# Patient Record
Sex: Female | Born: 1955 | Race: White | Marital: Married | Smoking: Former smoker
Health system: Southern US, Community
[De-identification: ages and names within clinical notes are randomized; demographics above are authoritative.]

## PROBLEM LIST (undated history)

## (undated) DIAGNOSIS — I471 Supraventricular tachycardia, unspecified: Secondary | ICD-10-CM

## (undated) DIAGNOSIS — K635 Polyp of colon: Secondary | ICD-10-CM

## (undated) DIAGNOSIS — M8580 Other specified disorders of bone density and structure, unspecified site: Secondary | ICD-10-CM

## (undated) DIAGNOSIS — E785 Hyperlipidemia, unspecified: Secondary | ICD-10-CM

## (undated) DIAGNOSIS — G43909 Migraine, unspecified, not intractable, without status migrainosus: Secondary | ICD-10-CM

## (undated) HISTORY — DX: Supraventricular tachycardia, unspecified: I47.10

## (undated) HISTORY — PX: FRACTURE SURGERY: SHX138

## (undated) HISTORY — DX: Other specified disorders of bone density and structure, unspecified site: M85.80

## (undated) HISTORY — PX: SINUS SURGERY WITH INSTATRAK: SHX5215

## (undated) HISTORY — PX: COLONOSCOPY: SHX174

## (undated) HISTORY — PX: ABDOMINAL HYSTERECTOMY: SHX81

## (undated) HISTORY — DX: Polyp of colon: K63.5

## (undated) HISTORY — PX: OTHER SURGICAL HISTORY: SHX169

## (undated) HISTORY — DX: Migraine, unspecified, not intractable, without status migrainosus: G43.909

## (undated) HISTORY — DX: Supraventricular tachycardia: I47.1

## (undated) HISTORY — DX: Hyperlipidemia, unspecified: E78.5

---

## 2010-07-28 ENCOUNTER — Encounter: Payer: Self-pay | Admitting: Internal Medicine

## 2010-07-28 ENCOUNTER — Ambulatory Visit (HOSPITAL_COMMUNITY)
Admission: RE | Admit: 2010-07-28 | Discharge: 2010-07-28 | Disposition: A | Discharge status: Home / Self Care | Source: Ambulatory Visit | Attending: Internal Medicine | Admitting: Internal Medicine

## 2010-07-28 ENCOUNTER — Ambulatory Visit: Payer: Self-pay | Admitting: Internal Medicine

## 2010-07-28 ENCOUNTER — Other Ambulatory Visit: Payer: Self-pay | Admitting: Internal Medicine

## 2010-07-28 ENCOUNTER — Encounter: Admitting: Internal Medicine

## 2010-07-28 DIAGNOSIS — Z1211 Encounter for screening for malignant neoplasm of colon: Secondary | ICD-10-CM | POA: Insufficient documentation

## 2010-07-28 DIAGNOSIS — K648 Other hemorrhoids: Secondary | ICD-10-CM | POA: Insufficient documentation

## 2010-07-28 DIAGNOSIS — D126 Benign neoplasm of colon, unspecified: Secondary | ICD-10-CM | POA: Insufficient documentation

## 2010-07-28 DIAGNOSIS — K5730 Diverticulosis of large intestine without perforation or abscess without bleeding: Secondary | ICD-10-CM | POA: Insufficient documentation

## 2010-07-28 DIAGNOSIS — E785 Hyperlipidemia, unspecified: Secondary | ICD-10-CM | POA: Insufficient documentation

## 2010-07-30 ENCOUNTER — Encounter: Payer: Self-pay | Admitting: Internal Medicine

## 2010-08-02 NOTE — Op Note (Signed)
  Sonia Lyons, Sonia Lyons               ACCOUNT NO.:  [id number]  MEDICAL RECORD NO.:  [id number]           PATIENT TYPE:  O  LOCATION:  DAYP                          FACILITY:  [facility]  PHYSICIAN:  R. Roetta Sessions, M.D. DATE OF BIRTH:  [birth year]  DATE OF PROCEDURE:  07/28/2010 DATE OF DISCHARGE:                              OPERATIVE REPORT   PROCEDURE:  Colonoscopy with snare polypectomy.  INDICATIONS FOR PROCEDURE:  A 55 year old lady with no lower GI tract symptoms, sent over courtesy Dr. Oval Linsey, for colorectal cancer screening.  No family history of colon cancer in any first-degree relatives, 2 grandparents did have colon cancer but in advanced age.  No prior imaging.  Colonoscopy is now being done as a screening maneuver. Risks, benefits, limitations, alternatives, and imponderable have been discussed, questions answered.  Please see the documentation medical record.  PROCEDURE NOTE:  O2 saturation, blood pressure, pulse, and respirations monitored throughout the entire procedure.  CONSCIOUS SEDATION:  Versed 6 mg IV and Demerol 100 mg IV in divided doses.  INSTRUMENT:  Pentax video chip system.  FINDINGS:  Digital rectal exam revealed no abnormalities.  Endoscopic findings:  Prep was adequate.  Colon:  Colonic mucosa was surveyed from the rectosigmoid junction through the left transverse right colon to the appendiceal orifice, ileocecal valve/cecum.  These structures were well seen and photographed for the record.  From this level, scope was slowly and cautiously withdrawn.  All previously mentioned mucosal surfaces were again seen.  On the way in, the patient was noted to have a 5-mm polyp on a stalk in the midst descending colon, it was hot snared and recovered.  The patient was also noted a few scattered pancolonic diverticula.  Remainder of colonic mucosa appeared normal.  The scope was pulled down in the rectum.  A thorough examination of rectal  mucosa including retroflexed view of the anal verge demonstrated only internal hemorrhoids.  The patient tolerated the procedure well.  Cecal withdrawal time 14 minutes.  IMPRESSION: 1. Internal hemorrhoids, otherwise normal rectum. 2. Few scattered pancolonic diverticula. 3. Descending colon polyp status post snare polypectomy.  RECOMMENDATIONS: 1. Diverticulosis and polyp literature provided to Ms. Eckels. 2. Follow up on path/further recommendations to follow.     Jonathon Bellows, M.D.     RMR/MEDQ  D:  07/28/2010  T:  07/28/2010  Job:  [id number]  cc:   Melvyn Novas, MD Fax: [phone]  Electronically Signed by Lorrin Goodell M.D. on 08/02/2010 10:27:41 AM

## 2010-08-03 NOTE — Letter (Addendum)
Summary: Patient Notice, Colon Biopsy Results  [facility]  [address]   [place], [place] [address]   Phone: [phone]  Fax: [phone]       July 30, 2010   Sonia Lyons [address] [place], [place]  [address] 04/22/1956    Dear Ms. Vecchio,  I am pleased to inform you that the biopsies taken during your recent colonoscopy did not show any evidence of cancer upon pathologic examination.  Additional information/recommendations:  No further action is needed at this time.  Please follow-up with your primary care physician for your other healthcare needs.  You should have a repeat colonoscopy examination  in 7 years.  Please call us if you are having persistent problems or have questions about your condition that have not been fully answered at this time.  Sincerely,    R. Roetta Sessions MD, FACP [facility] Ph: [phone]    Fax: [phone]   Appended Document: Patient Notice, Colon Biopsy Results letter mailed to pt  Appended Document: Patient Notice, Colon Biopsy Results reminder in epic

## 2010-09-01 ENCOUNTER — Other Ambulatory Visit (HOSPITAL_COMMUNITY): Payer: Self-pay | Admitting: Family Medicine

## 2010-09-01 DIAGNOSIS — Z139 Encounter for screening, unspecified: Secondary | ICD-10-CM

## 2010-09-20 ENCOUNTER — Ambulatory Visit (HOSPITAL_COMMUNITY)
Admission: RE | Admit: 2010-09-20 | Discharge: 2010-09-20 | Disposition: A | Discharge status: Home / Self Care | Source: Ambulatory Visit | Attending: Family Medicine | Admitting: Family Medicine

## 2010-09-20 DIAGNOSIS — Z1231 Encounter for screening mammogram for malignant neoplasm of breast: Secondary | ICD-10-CM | POA: Insufficient documentation

## 2010-09-20 DIAGNOSIS — Z139 Encounter for screening, unspecified: Secondary | ICD-10-CM

## 2010-09-22 ENCOUNTER — Other Ambulatory Visit: Payer: Self-pay | Admitting: Family Medicine

## 2010-09-22 DIAGNOSIS — R928 Other abnormal and inconclusive findings on diagnostic imaging of breast: Secondary | ICD-10-CM

## 2010-09-23 ENCOUNTER — Other Ambulatory Visit: Payer: Self-pay | Admitting: Family Medicine

## 2010-09-23 DIAGNOSIS — R928 Other abnormal and inconclusive findings on diagnostic imaging of breast: Secondary | ICD-10-CM

## 2010-09-24 ENCOUNTER — Ambulatory Visit
Admission: RE | Admit: 2010-09-24 | Discharge: 2010-09-24 | Disposition: A | Discharge status: Home / Self Care | Source: Ambulatory Visit | Attending: Family Medicine | Admitting: Family Medicine

## 2010-09-24 DIAGNOSIS — R928 Other abnormal and inconclusive findings on diagnostic imaging of breast: Secondary | ICD-10-CM

## 2011-10-24 ENCOUNTER — Other Ambulatory Visit: Payer: Self-pay | Admitting: Family Medicine

## 2011-10-24 DIAGNOSIS — Z139 Encounter for screening, unspecified: Secondary | ICD-10-CM

## 2011-10-25 ENCOUNTER — Ambulatory Visit (HOSPITAL_COMMUNITY)

## 2011-11-03 ENCOUNTER — Ambulatory Visit (HOSPITAL_COMMUNITY)
Admission: RE | Admit: 2011-11-03 | Discharge: 2011-11-03 | Disposition: A | Discharge status: Home / Self Care | Source: Ambulatory Visit | Attending: Family Medicine | Admitting: Family Medicine

## 2011-11-03 ENCOUNTER — Other Ambulatory Visit: Payer: Self-pay | Admitting: Family Medicine

## 2011-11-03 DIAGNOSIS — Z139 Encounter for screening, unspecified: Secondary | ICD-10-CM

## 2011-11-03 DIAGNOSIS — Z1231 Encounter for screening mammogram for malignant neoplasm of breast: Secondary | ICD-10-CM | POA: Insufficient documentation

## 2011-11-08 ENCOUNTER — Other Ambulatory Visit: Payer: Self-pay | Admitting: Family Medicine

## 2011-11-08 DIAGNOSIS — R928 Other abnormal and inconclusive findings on diagnostic imaging of breast: Secondary | ICD-10-CM

## 2011-11-23 ENCOUNTER — Ambulatory Visit (HOSPITAL_COMMUNITY)

## 2011-11-23 ENCOUNTER — Ambulatory Visit (HOSPITAL_COMMUNITY)
Admission: RE | Admit: 2011-11-23 | Discharge: 2011-11-23 | Disposition: A | Discharge status: Home / Self Care | Source: Ambulatory Visit | Attending: Family Medicine | Admitting: Family Medicine

## 2011-11-23 ENCOUNTER — Other Ambulatory Visit (HOSPITAL_COMMUNITY): Payer: Self-pay | Admitting: Family Medicine

## 2011-11-23 DIAGNOSIS — R928 Other abnormal and inconclusive findings on diagnostic imaging of breast: Secondary | ICD-10-CM

## 2011-11-23 DIAGNOSIS — N63 Unspecified lump in unspecified breast: Secondary | ICD-10-CM | POA: Insufficient documentation

## 2013-04-25 ENCOUNTER — Other Ambulatory Visit: Payer: Self-pay | Admitting: Family Medicine

## 2013-04-25 DIAGNOSIS — Z139 Encounter for screening, unspecified: Secondary | ICD-10-CM

## 2013-04-30 ENCOUNTER — Ambulatory Visit (HOSPITAL_COMMUNITY)
Admission: RE | Admit: 2013-04-30 | Discharge: 2013-04-30 | Disposition: A | Discharge status: Home / Self Care | Source: Ambulatory Visit | Attending: Family Medicine | Admitting: Family Medicine

## 2013-04-30 DIAGNOSIS — Z1231 Encounter for screening mammogram for malignant neoplasm of breast: Secondary | ICD-10-CM | POA: Insufficient documentation

## 2013-04-30 DIAGNOSIS — Z139 Encounter for screening, unspecified: Secondary | ICD-10-CM

## 2013-05-07 ENCOUNTER — Other Ambulatory Visit: Payer: Self-pay | Admitting: Family Medicine

## 2013-05-07 DIAGNOSIS — R928 Other abnormal and inconclusive findings on diagnostic imaging of breast: Secondary | ICD-10-CM

## 2013-05-10 ENCOUNTER — Other Ambulatory Visit

## 2013-05-10 DIAGNOSIS — Z79899 Other long term (current) drug therapy: Secondary | ICD-10-CM

## 2013-05-10 DIAGNOSIS — Z0000 Encounter for general adult medical examination without abnormal findings: Secondary | ICD-10-CM

## 2013-05-10 DIAGNOSIS — E785 Hyperlipidemia, unspecified: Secondary | ICD-10-CM

## 2013-05-10 LAB — LIPID PANEL
LDL Cholesterol: 140 mg/dL — ABNORMAL HIGH (ref 0–99)
VLDL: 19 mg/dL (ref 0–40)

## 2013-05-10 LAB — COMPLETE METABOLIC PANEL WITH GFR
ALT: 9 U/L (ref 0–35)
AST: 14 U/L (ref 0–37)
Albumin: 4.5 g/dL (ref 3.5–5.2)
Calcium: 9.6 mg/dL (ref 8.4–10.5)
Chloride: 102 mEq/L (ref 96–112)
Creat: 0.85 mg/dL (ref 0.50–1.10)
Potassium: 4.8 mEq/L (ref 3.5–5.3)

## 2013-05-10 LAB — CBC WITH DIFFERENTIAL/PLATELET
Hemoglobin: 13.4 g/dL (ref 12.0–15.0)
Lymphs Abs: 1.3 10*3/uL (ref 0.7–4.0)
Monocytes Relative: 9 % (ref 3–12)
Neutro Abs: 1.8 10*3/uL (ref 1.7–7.7)
Neutrophils Relative %: 52 % (ref 43–77)
RBC: 4.37 MIL/uL (ref 3.87–5.11)

## 2013-05-10 LAB — TSH: TSH: 0.688 u[IU]/mL (ref 0.350–4.500)

## 2013-05-11 LAB — VITAMIN D 25 HYDROXY (VIT D DEFICIENCY, FRACTURES): Vit D, 25-Hydroxy: 49 ng/mL (ref 30–89)

## 2013-05-13 ENCOUNTER — Ambulatory Visit (INDEPENDENT_AMBULATORY_CARE_PROVIDER_SITE_OTHER): Admitting: Family Medicine

## 2013-05-13 ENCOUNTER — Encounter: Payer: Self-pay | Admitting: Family Medicine

## 2013-05-13 VITALS — BP 120/60 | HR 64 | Temp 97.9°F | Resp 14 | Ht 72.0 in | Wt 154.0 lb

## 2013-05-13 DIAGNOSIS — Z0000 Encounter for general adult medical examination without abnormal findings: Secondary | ICD-10-CM

## 2013-05-13 DIAGNOSIS — G43909 Migraine, unspecified, not intractable, without status migrainosus: Secondary | ICD-10-CM | POA: Insufficient documentation

## 2013-05-13 NOTE — Progress Notes (Signed)
Subjective:    Patient ID: Sonia Lyons, female    DOB: [birth year], 57 y.o.   MRN: [id number]  HPI  Patient is here today for complete physical exam.  She has no concerns. She sees a GYN doctor who performs her breast exam, her Pap smear, and her pelvic exam. Her mammogram is up-to-date. Her Pap smear is up to date. Her colonoscopy was performed in 2012. Her last tetanus shot was in 2012.  She had a flu shot earlier this year. Her most recent labwork as listed: Lab on 05/10/2013  Component Date Value Range Status  . Cholesterol 05/10/2013 216* 0 - 200 mg/dL Final   Comment: ATP III Classification:                                < 200        mg/dL        Desirable                               200 - 239     mg/dL        Borderline High                               >= 240        mg/dL        High                             . Triglycerides 05/10/2013 93  <150 mg/dL Final  . HDL 21/30/8657 57  >39 mg/dL Final  . Total CHOL/HDL Ratio 05/10/2013 3.8   Final  . VLDL 05/10/2013 19  0 - 40 mg/dL Final  . LDL Cholesterol 05/10/2013 140* 0 - 99 mg/dL Final   Comment:                            Total Cholesterol/HDL Ratio:CHD Risk                                                 Coronary Heart Disease Risk Table                                                                 Men       Women                                   1/2 Average Risk              3.4        3.3                                       Average Risk  5.0        4.4                                    2X Average Risk              9.6        7.1                                    3X Average Risk             23.4       11.0                          Use the calculated Patient Ratio above and the CHD Risk table                           to determine the patient's CHD Risk.                          ATP III Classification (LDL):                                < 100        mg/dL         Optimal        100 - 129     mg/dL         Near or Above Optimal                               130 - 159     mg/dL         Borderline High                               160 - 189     mg/dL         High                                > 190        mg/dL         Very High                             . WBC 05/10/2013 3.4* 4.0 - 10.5 K/uL Final  . RBC 05/10/2013 4.37  3.87 - 5.11 MIL/uL Final  . Hemoglobin 05/10/2013 13.4  12.0 - 15.0 g/dL Final  . HCT 40/98/1191 39.4  36.0 - 46.0 % Final  . MCV 05/10/2013 90.2  78.0 - 100.0 fL Final  . MCH 05/10/2013 30.7  26.0 - 34.0 pg Final  . MCHC 05/10/2013 34.0  30.0 - 36.0 g/dL Final  . RDW 47/82/9562 12.9  11.5 - 15.5 % Final  . Platelets 05/10/2013 225  150 - 400 K/uL Final  . Neutrophils Relative % 05/10/2013 52  43 - 77 % Final  . Neutro Abs 05/10/2013 1.8  1.7 - 7.7 K/uL Final  . Lymphocytes  Relative 05/10/2013 38  12 - 46 % Final  . Lymphs Abs 05/10/2013 1.3  0.7 - 4.0 K/uL Final  . Monocytes Relative 05/10/2013 9  3 - 12 % Final  . Monocytes Absolute 05/10/2013 0.3  0.1 - 1.0 K/uL Final  . Eosinophils Relative 05/10/2013 1  0 - 5 % Final  . Eosinophils Absolute 05/10/2013 0.0  0.0 - 0.7 K/uL Final  . Basophils Relative 05/10/2013 0  0 - 1 % Final  . Basophils Absolute 05/10/2013 0.0  0.0 - 0.1 K/uL Final  . Smear Review 05/10/2013 Criteria for review not met   Final  . TSH 05/10/2013 0.688  0.350 - 4.500 uIU/mL Final  . Vit D, 25-Hydroxy 05/10/2013 49  30 - 89 ng/mL Final   Comment: This assay accurately quantifies Vitamin D, which is the sum of the                          25-Hydroxy forms of Vitamin D2 and D3.  Studies have shown that the                          optimum concentration of 25-Hydroxy Vitamin D is 30 ng/mL or higher.                           Concentrations of Vitamin D between 20 and 29 ng/mL are considered to                          be insufficient and concentrations less than 20 ng/mL are considered                          to be  deficient for Vitamin D.  . Sodium 05/10/2013 139  135 - 145 mEq/L Final  . Potassium 05/10/2013 4.8  3.5 - 5.3 mEq/L Final  . Chloride 05/10/2013 102  96 - 112 mEq/L Final  . CO2 05/10/2013 30  19 - 32 mEq/L Final  . Glucose, Bld 05/10/2013 75  70 - 99 mg/dL Final  . BUN 16/02/9603 16  6 - 23 mg/dL Final  . Creat 54/01/8118 0.85  0.50 - 1.10 mg/dL Final  . Total Bilirubin 05/10/2013 0.7  0.3 - 1.2 mg/dL Final  . Alkaline Phosphatase 05/10/2013 51  39 - 117 U/L Final  . AST 05/10/2013 14  0 - 37 U/L Final  . ALT 05/10/2013 9  0 - 35 U/L Final  . Total Protein 05/10/2013 6.9  6.0 - 8.3 g/dL Final  . Albumin 14/78/2956 4.5  3.5 - 5.2 g/dL Final  . Calcium 21/30/8657 9.6  8.4 - 10.5 mg/dL Final  . GFR, Est African American 05/10/2013 89   Final  . GFR, Est Non African American 05/10/2013 77   Final   Comment:                            The estimated GFR is a calculation valid for adults (>=80 years old)                          that uses the CKD-EPI algorithm to adjust for age and sex. It is  not to be used for children, pregnant women, hospitalized patients,                             patients on dialysis, or with rapidly changing kidney function.                          According to the NKDEP, eGFR >89 is normal, 60-89 shows mild                          impairment, 30-59 shows moderate impairment, 15-29 shows severe                          impairment and <15 is ESRD.                              Past Medical History  Diagnosis Date  . Hyperlipidemia   . Migraine   . Colon polyp   . SVT (supraventricular tachycardia)    Past Surgical History  Procedure Laterality Date  . Abdominal hysterectomy      fibroids   No current outpatient prescriptions on file prior to visit.   No current facility-administered medications on file prior to visit.   Allergies  Allergen Reactions  . Codeine Nausea Only   History   Social History  . Marital Status:  Married    Spouse Name: N/A    Number of Children: N/A  . Years of Education: N/A   Occupational History  . Not on file.   Social History Main Topics  . Smoking status: Former Games developer  . Smokeless tobacco: Not on file  . Alcohol Use: Yes     Comment: Rare  . Drug Use: No  . Sexual Activity: Yes     Comment: married to [place]   Other Topics Concern  . Not on file   Social History Narrative  . No narrative on file   Family History  Problem Relation Age of Onset  . Hyperlipidemia Father   . Diabetes Sister   . Cancer Sister 49    breast   . HIV Brother      Review of Systems  All other systems reviewed and are negative.       Objective:   Physical Exam  Vitals reviewed. Constitutional: She is oriented to person, place, and time. She appears well-developed and well-nourished. No distress.  HENT:  Head: Normocephalic and atraumatic.  Right Ear: External ear normal.  Left Ear: External ear normal.  Nose: Nose normal.  Mouth/Throat: Oropharynx is clear and moist. No oropharyngeal exudate.  Eyes: Conjunctivae and EOM are normal. Pupils are equal, round, and reactive to light. Right eye exhibits no discharge. Left eye exhibits no discharge. No scleral icterus.  Neck: Normal range of motion. Neck supple. No JVD present. No tracheal deviation present. No thyromegaly present.  Cardiovascular: Normal rate, regular rhythm, normal heart sounds and intact distal pulses.  Exam reveals no gallop and no friction rub.   No murmur heard. Pulmonary/Chest: Effort normal and breath sounds normal. No stridor. No respiratory distress. She has no wheezes. She has no rales. She exhibits no tenderness.  Abdominal: Soft. Bowel sounds are normal. She exhibits no distension and no mass. There is no tenderness. There is no rebound and no guarding.  Musculoskeletal: Normal  range of motion. She exhibits no edema and no tenderness.  Lymphadenopathy:    She has no cervical adenopathy.    Neurological: She is alert and oriented to person, place, and time. She has normal reflexes. She displays normal reflexes. No cranial nerve deficit. She exhibits normal muscle tone. Coordination normal.  Skin: Skin is warm. No rash noted. She is not diaphoretic. No erythema. No pallor.  Psychiatric: She has a normal mood and affect. Her behavior is normal. Thought content normal.          Assessment & Plan:  1. Routine general medical examination at a health care facility Patient's physical exam today is completely normal. Her cancer screening is up to date. Her immunizations are up-to-date. The only abnormality seen on her lab work as an LDL in the 140s. Her goal LDL based on risk factors would be less than 160. She has one wrist point for age greater than 18 years old. She has no hypertension. She has no family history of early coronary artery disease. She has no smoking history. Her HDL is normal. She is not a diabetic. Therefore I do not see any indication for starting the patient on statin therapy. I did recommend suture of 2 g by mouth daily and a low saturated fat diet. The patient is in agreement with this.

## 2013-05-20 ENCOUNTER — Ambulatory Visit
Admission: RE | Admit: 2013-05-20 | Discharge: 2013-05-20 | Disposition: A | Discharge status: Home / Self Care | Source: Ambulatory Visit | Attending: Family Medicine | Admitting: Family Medicine

## 2013-05-20 DIAGNOSIS — R928 Other abnormal and inconclusive findings on diagnostic imaging of breast: Secondary | ICD-10-CM

## 2014-03-27 ENCOUNTER — Ambulatory Visit (INDEPENDENT_AMBULATORY_CARE_PROVIDER_SITE_OTHER): Admitting: Family Medicine

## 2014-03-27 ENCOUNTER — Encounter: Payer: Self-pay | Admitting: Family Medicine

## 2014-03-27 VITALS — BP 110/64 | HR 69 | Temp 97.6°F | Resp 18 | Ht 72.0 in | Wt 156.0 lb

## 2014-03-27 DIAGNOSIS — R002 Palpitations: Secondary | ICD-10-CM

## 2014-03-27 LAB — CBC WITH DIFFERENTIAL/PLATELET
BASOS ABS: 0 10*3/uL (ref 0.0–0.1)
BASOS PCT: 0 % (ref 0–1)
Eosinophils Absolute: 0 10*3/uL (ref 0.0–0.7)
Eosinophils Relative: 1 % (ref 0–5)
HCT: 39.9 % (ref 36.0–46.0)
HEMOGLOBIN: 13.5 g/dL (ref 12.0–15.0)
LYMPHS PCT: 38 % (ref 12–46)
Lymphs Abs: 1.7 10*3/uL (ref 0.7–4.0)
MCH: 30.2 pg (ref 26.0–34.0)
MCHC: 33.8 g/dL (ref 30.0–36.0)
MCV: 89.3 fL (ref 78.0–100.0)
MONOS PCT: 8 % (ref 3–12)
Monocytes Absolute: 0.4 10*3/uL (ref 0.1–1.0)
NEUTROS ABS: 2.4 10*3/uL (ref 1.7–7.7)
NEUTROS PCT: 53 % (ref 43–77)
Platelets: 263 10*3/uL (ref 150–400)
RBC: 4.47 MIL/uL (ref 3.87–5.11)
RDW: 13.4 % (ref 11.5–15.5)
WBC: 4.6 10*3/uL (ref 4.0–10.5)

## 2014-03-27 LAB — COMPLETE METABOLIC PANEL WITH GFR
ALBUMIN: 4.4 g/dL (ref 3.5–5.2)
ALK PHOS: 66 U/L (ref 39–117)
ALT: 11 U/L (ref 0–35)
AST: 15 U/L (ref 0–37)
BUN: 12 mg/dL (ref 6–23)
CHLORIDE: 100 meq/L (ref 96–112)
CO2: 25 mEq/L (ref 19–32)
Calcium: 9.7 mg/dL (ref 8.4–10.5)
Creat: 0.79 mg/dL (ref 0.50–1.10)
GFR, Est African American: >89 mL/min
GFR, Est Non African American: 84 mL/min
Glucose, Bld: 83 mg/dL (ref 70–99)
POTASSIUM: 4.1 meq/L (ref 3.5–5.3)
SODIUM: 139 meq/L (ref 135–145)
TOTAL PROTEIN: 7.1 g/dL (ref 6.0–8.3)
Total Bilirubin: 0.4 mg/dL (ref 0.2–1.2)

## 2014-03-27 LAB — TSH: TSH: 1.234 u[IU]/mL (ref 0.350–4.500)

## 2014-03-27 NOTE — Progress Notes (Signed)
Subjective:    Patient ID: Sonia Lyons, female    DOB: [birth year], 58 y.o.   MRN: [id number]  HPI Patient is a very pleasant 58 year old white female with a history of supraventricular tachycardia. She has been doing very well up until recently. Approximate 3 weeks ago she developed a fluttering sensation in her chest. She states that it does not feel like her previous episodes of SVT. It does not feel like a heartbeat at all. Instead it feels more like a vibration underneath her left breast. She denies any syncope or chest pain. She denies any shortness of breath or dyspnea on exertion. Her EKG today shows normal sinus rhythm at 68 bpm with normal intervals and a normal axis. There is no evidence of Wolff-Parkinson-White, long QT syndrome, or Brugada syndrome.  Her symptoms do not sound like a nerve either. She denies any paresthesias or pain.  It could possibly be a muscle fasciculation in the underlying pectoralis muscle. In fact the patient states it feels similar to the way her eye feels when she has a muscle fasciculation in her eyelid. Past Medical History  Diagnosis Date  . Hyperlipidemia   . Migraine   . Colon polyp   . SVT (supraventricular tachycardia)    Past Surgical History  Procedure Laterality Date  . Abdominal hysterectomy      fibroids   Current Outpatient Prescriptions on File Prior to Visit  Medication Sig Dispense Refill  . OVER THE COUNTER MEDICATION [place]     No current facility-administered medications on file prior to visit.   Allergies  Allergen Reactions  . Codeine Nausea Only   History   Social History  . Marital Status: Married    Spouse Name: N/A    Number of Children: N/A  . Years of Education: N/A   Occupational History  . Not on file.   Social History Main Topics  . Smoking status: Former Research scientist (life sciences)  . Smokeless tobacco: Not on file  . Alcohol Use: Yes     Comment: Rare  . Drug Use: No  . Sexual Activity: Yes     Comment: married  to [place]   Other Topics Concern  . Not on file   Social History Narrative      Review of Systems  All other systems reviewed and are negative.      Objective:   Physical Exam  Constitutional: She appears well-developed and well-nourished.  Neck: Neck supple. No JVD present. No tracheal deviation present. No thyromegaly present.  Cardiovascular: Normal rate, regular rhythm and normal heart sounds.  Exam reveals no gallop and no friction rub.   No murmur heard. Pulmonary/Chest: Effort normal and breath sounds normal. No respiratory distress. She has no wheezes. She has no rales.  Abdominal: Soft. Bowel sounds are normal.  Vitals reviewed.         Assessment & Plan:  Palpitations - Plan: CBC with Differential, COMPLETE METABOLIC PANEL WITH GFR, TSH  Patient symptoms are hard to characterize. I believe she is describing a muscle fasciculation in her underlying pectoralis major muscle. However given her history of SVT I believe it is prudent that we have her wear a 24-hour cardiac monitor to rule out cardiac arrhythmias. The patient states that this occurs every day and we should be able to capture this with a 24-hour monitor. I will also check a CMP as well as a TSH to rule out electrolyte abnormalities that may be predisposing the patient to this condition. Patient  does admit that she has not been sleeping well and this may be contributing either to PVCs or even muscle fasciculations.

## 2014-03-28 ENCOUNTER — Ambulatory Visit: Admitting: Family Medicine

## 2014-03-28 ENCOUNTER — Ambulatory Visit (INDEPENDENT_AMBULATORY_CARE_PROVIDER_SITE_OTHER): Admitting: Family Medicine

## 2014-03-28 ENCOUNTER — Encounter: Payer: Self-pay | Admitting: Family Medicine

## 2014-03-28 DIAGNOSIS — R002 Palpitations: Secondary | ICD-10-CM

## 2014-03-28 NOTE — Progress Notes (Signed)
Patient ID: Sonia Lyons, female   DOB: 07/27/1955, 58 y.o.   MRN: [id number] Pt returns today after wearing 24 hr Holter Monitor.  She denies any problems while wearing monitor.  She did return her diary with some entries noted.  Pt told approximately one week turn around for results.

## 2014-04-08 ENCOUNTER — Encounter: Payer: Self-pay | Admitting: Family Medicine

## 2014-04-09 ENCOUNTER — Encounter: Payer: Self-pay | Admitting: Family Medicine

## 2014-06-06 ENCOUNTER — Other Ambulatory Visit: Payer: Self-pay | Admitting: Family Medicine

## 2014-06-06 DIAGNOSIS — Z1231 Encounter for screening mammogram for malignant neoplasm of breast: Secondary | ICD-10-CM

## 2014-06-20 ENCOUNTER — Ambulatory Visit (HOSPITAL_COMMUNITY)
Admission: RE | Admit: 2014-06-20 | Discharge: 2014-06-20 | Disposition: A | Discharge status: Home / Self Care | Source: Ambulatory Visit | Attending: Family Medicine | Admitting: Family Medicine

## 2014-06-20 DIAGNOSIS — Z1231 Encounter for screening mammogram for malignant neoplasm of breast: Secondary | ICD-10-CM | POA: Diagnosis present

## 2015-05-20 ENCOUNTER — Other Ambulatory Visit: Payer: Self-pay | Admitting: Family Medicine

## 2015-05-20 DIAGNOSIS — Z1231 Encounter for screening mammogram for malignant neoplasm of breast: Secondary | ICD-10-CM

## 2015-06-22 ENCOUNTER — Ambulatory Visit (HOSPITAL_COMMUNITY)
Admission: RE | Admit: 2015-06-22 | Discharge: 2015-06-22 | Disposition: A | Discharge status: Home / Self Care | Source: Ambulatory Visit | Attending: Family Medicine | Admitting: Family Medicine

## 2015-06-22 DIAGNOSIS — Z1231 Encounter for screening mammogram for malignant neoplasm of breast: Secondary | ICD-10-CM | POA: Insufficient documentation

## 2015-08-04 ENCOUNTER — Ambulatory Visit (INDEPENDENT_AMBULATORY_CARE_PROVIDER_SITE_OTHER): Admitting: Family Medicine

## 2015-08-04 ENCOUNTER — Encounter: Payer: Self-pay | Admitting: Family Medicine

## 2015-08-04 VITALS — BP 142/76 | HR 84 | Temp 97.6°F | Resp 16 | Wt 159.0 lb

## 2015-08-04 DIAGNOSIS — R350 Frequency of micturition: Secondary | ICD-10-CM

## 2015-08-04 DIAGNOSIS — N3001 Acute cystitis with hematuria: Secondary | ICD-10-CM

## 2015-08-04 LAB — URINALYSIS, MICROSCOPIC ONLY
CASTS: NONE SEEN [LPF]
CRYSTALS: NONE SEEN [HPF]
Squamous Epithelial / LPF: NONE SEEN [HPF] (ref <=5)
YEAST: NONE SEEN [HPF]

## 2015-08-04 LAB — URINALYSIS, ROUTINE W REFLEX MICROSCOPIC
BILIRUBIN URINE: NEGATIVE
GLUCOSE, UA: NEGATIVE
KETONES UR: NEGATIVE
Nitrite: NEGATIVE
PH: 5.5 (ref 5.0–8.0)
Protein, ur: NEGATIVE
SPECIFIC GRAVITY, URINE: 1.025 (ref 1.001–1.035)

## 2015-08-04 MED ORDER — SULFAMETHOXAZOLE-TRIMETHOPRIM 800-160 MG PO TABS: 1.0000 | ORAL_TABLET | Freq: Two times a day (BID) | ORAL | Status: DC

## 2015-08-04 NOTE — Progress Notes (Signed)
   Subjective:    Patient ID: Sonia Lyons, female    DOB: 1955/08/17, 60 y.o.   MRN: [id number]  HPI Patient reports 3 weeks of increased urinary frequency, urgency, and some hesitancy. She denies any dysuria. She has been treating herself with cranberry juice which temporarily helps the symptoms but then they returned shortly thereafter. Urinalysis today shows +2 blood and +1 leukocyte esterase Past Medical History  Diagnosis Date  . Hyperlipidemia   . Migraine   . Colon polyp   . SVT (supraventricular tachycardia) [facility]    Past Surgical History  Procedure Laterality Date  . Abdominal hysterectomy      fibroids   Current Outpatient Prescriptions on File Prior to Visit  Medication Sig Dispense Refill  . OVER THE COUNTER MEDICATION [place]     No current facility-administered medications on file prior to visit.   Allergies  Allergen Reactions  . Codeine Nausea Only   Social History   Social History  . Marital Status: Married    Spouse Name: N/A  . Number of Children: N/A  . Years of Education: N/A   Occupational History  . Not on file.   Social History Main Topics  . Smoking status: Former Research scientist (life sciences)  . Smokeless tobacco: Not on file  . Alcohol Use: Yes     Comment: Rare  . Drug Use: No  . Sexual Activity: Yes     Comment: married to [place]   Other Topics Concern  . Not on file   Social History Narrative      Review of Systems  All other systems reviewed and are negative.      Objective:   Physical Exam  Cardiovascular: Normal rate and regular rhythm.   Pulmonary/Chest: Effort normal and breath sounds normal.  Abdominal: Soft. Bowel sounds are normal.  Vitals reviewed.         Assessment & Plan:  Frequent urination - Plan: Urinalysis, Routine w reflex microscopic (not at [facility]), sulfamethoxazole-trimethoprim (BACTRIM DS,SEPTRA DS) 800-160 MG tablet, Urine culture  Acute cystitis with hematuria  Begin Bactrim double strength 1 by mouth  twice a day for 7 days. Send urine culture. If symptoms persist and urine culture is negative, consider overactive bladder and empiric treatment with Vesicare

## 2015-08-08 LAB — URINE CULTURE: Colony Count: >=100000

## 2016-01-15 ENCOUNTER — Other Ambulatory Visit

## 2016-01-15 DIAGNOSIS — E785 Hyperlipidemia, unspecified: Secondary | ICD-10-CM

## 2016-01-15 DIAGNOSIS — Z0000 Encounter for general adult medical examination without abnormal findings: Secondary | ICD-10-CM

## 2016-01-15 LAB — CBC WITH DIFFERENTIAL/PLATELET
BASOS PCT: 1 %
Basophils Absolute: 37 cells/uL (ref 0–200)
EOS ABS: 37 {cells}/uL (ref 15–500)
Eosinophils Relative: 1 %
HEMATOCRIT: 39.6 % (ref 35.0–45.0)
HEMOGLOBIN: 13 g/dL (ref 12.0–15.0)
LYMPHS ABS: 1332 {cells}/uL (ref 850–3900)
LYMPHS PCT: 36 %
MCH: 30 pg (ref 27.0–33.0)
MCHC: 32.8 g/dL (ref 32.0–36.0)
MCV: 91.2 fL (ref 80.0–100.0)
MONO ABS: 296 {cells}/uL (ref 200–950)
MPV: 9.6 fL (ref 7.5–12.5)
Monocytes Relative: 8 %
Neutro Abs: 1998 cells/uL (ref 1500–7800)
Neutrophils Relative %: 54 %
Platelets: 233 10*3/uL (ref 140–400)
RBC: 4.34 MIL/uL (ref 3.80–5.10)
RDW: 13.2 % (ref 11.0–15.0)
WBC: 3.7 10*3/uL — AB (ref 3.8–10.8)

## 2016-01-15 LAB — COMPLETE METABOLIC PANEL WITH GFR
ALBUMIN: 4.2 g/dL (ref 3.6–5.1)
ALK PHOS: 66 U/L (ref 33–130)
ALT: 10 U/L (ref 6–29)
AST: 15 U/L (ref 10–35)
BUN: 16 mg/dL (ref 7–25)
CALCIUM: 9.5 mg/dL (ref 8.6–10.4)
CHLORIDE: 104 mmol/L (ref 98–110)
CO2: 28 mmol/L (ref 20–31)
CREATININE: 0.86 mg/dL (ref 0.50–1.05)
GFR, EST AFRICAN AMERICAN: 86 mL/min (ref >=60)
GFR, Est Non African American: 75 mL/min (ref >=60)
Glucose, Bld: 79 mg/dL (ref 70–99)
POTASSIUM: 4.9 mmol/L (ref 3.5–5.3)
Sodium: 141 mmol/L (ref 135–146)
Total Bilirubin: 0.7 mg/dL (ref 0.2–1.2)
Total Protein: 6.6 g/dL (ref 6.1–8.1)

## 2016-01-15 LAB — LIPID PANEL
CHOL/HDL RATIO: 3 ratio (ref <=5.0)
CHOLESTEROL: 206 mg/dL — AB (ref 125–200)
HDL: 69 mg/dL (ref >=46)
LDL Cholesterol: 119 mg/dL (ref <130)
Triglycerides: 92 mg/dL (ref <150)
VLDL: 18 mg/dL (ref <30)

## 2016-01-15 LAB — TSH: TSH: 0.84 m[IU]/L

## 2016-01-28 ENCOUNTER — Encounter: Payer: Self-pay | Admitting: Family Medicine

## 2016-01-28 ENCOUNTER — Ambulatory Visit (INDEPENDENT_AMBULATORY_CARE_PROVIDER_SITE_OTHER): Admitting: Family Medicine

## 2016-01-28 VITALS — BP 134/70 | HR 68 | Temp 97.7°F | Resp 14 | Ht 72.0 in | Wt 158.0 lb

## 2016-01-28 DIAGNOSIS — Z0000 Encounter for general adult medical examination without abnormal findings: Secondary | ICD-10-CM

## 2016-01-28 NOTE — Progress Notes (Signed)
Subjective:    Patient ID: Sonia Lyons, female    DOB: March 07, 1956, 60 y.o.   MRN: [id number]  HPI Patient is here today for complete physical exam. Her only concern is some mild hair loss in an androgenic pattern. Otherwise she is doing well. Last colonoscopy was performed in 2012. Her last mammogram was performed in January 2017. Pap smears performed by her gynecologist. Tetanus shots up-to-date. She is not due for shingles vaccine or pneumonia vaccine yet. She is due for a flu shot but she declines this today. There is no family history of osteoporosis. She's never had a bone density test but she is not yet 67. Most recent lab work as listed below Lab on 01/15/2016  Component Date Value Ref Range Status  . Sodium 01/15/2016 141  135 - 146 mmol/L Final  . Potassium 01/15/2016 4.9  3.5 - 5.3 mmol/L Final  . Chloride 01/15/2016 104  98 - 110 mmol/L Final  . CO2 01/15/2016 28  20 - 31 mmol/L Final  . Glucose, Bld 01/15/2016 79  70 - 99 mg/dL Final  . BUN 01/15/2016 16  7 - 25 mg/dL Final  . Creat 01/15/2016 0.86  0.50 - 1.05 mg/dL Final   Comment:   For patients > or = 58 years of age: The upper reference limit for Creatinine is approximately 13% higher for people identified as African-American.     . Total Bilirubin 01/15/2016 0.7  0.2 - 1.2 mg/dL Final  . Alkaline Phosphatase 01/15/2016 66  33 - 130 U/L Final  . AST 01/15/2016 15  10 - 35 U/L Final  . ALT 01/15/2016 10  6 - 29 U/L Final  . Total Protein 01/15/2016 6.6  6.1 - 8.1 g/dL Final  . Albumin 01/15/2016 4.2  3.6 - 5.1 g/dL Final  . Calcium 01/15/2016 9.5  8.6 - 10.4 mg/dL Final  . GFR, Est African American 01/15/2016 86  >=60 mL/min Final  . GFR, Est Non African American 01/15/2016 75  >=60 mL/min Final  . TSH 01/15/2016 0.84  mIU/L Final   Comment:   Reference Range   > or = 20 Years  0.40-4.50   Pregnancy Range First trimester  0.26-2.66 Second trimester 0.55-2.73 Third trimester  0.43-2.91     . Cholesterol  01/15/2016 206* 125 - 200 mg/dL Final  . Triglycerides 01/15/2016 92  <150 mg/dL Final  . HDL 01/15/2016 69  >=46 mg/dL Final  . Total CHOL/HDL Ratio 01/15/2016 3.0  <=5.0 Ratio Final  . VLDL 01/15/2016 18  <30 mg/dL Final  . LDL Cholesterol 01/15/2016 119  <130 mg/dL Final   Comment:   Total Cholesterol/HDL Ratio:CHD Risk                        Coronary Heart Disease Risk Table                                        Men       Women          1/2 Average Risk              3.4        3.3              Average Risk              5.0  4.4           2X Average Risk              9.6        7.1           3X Average Risk             23.4       11.0 Use the calculated Patient Ratio above and the CHD Risk table  to determine the patient's CHD Risk.   . WBC 01/15/2016 3.7* 3.8 - 10.8 K/uL Final  . RBC 01/15/2016 4.34  3.80 - 5.10 MIL/uL Final  . Hemoglobin 01/15/2016 13.0  12.0 - 15.0 g/dL Final  . HCT 01/15/2016 39.6  35.0 - 45.0 % Final  . MCV 01/15/2016 91.2  80.0 - 100.0 fL Final  . MCH 01/15/2016 30.0  27.0 - 33.0 pg Final  . MCHC 01/15/2016 32.8  32.0 - 36.0 g/dL Final  . RDW 01/15/2016 13.2  11.0 - 15.0 % Final  . Platelets 01/15/2016 233  140 - 400 K/uL Final  . MPV 01/15/2016 9.6  7.5 - 12.5 fL Final  . Neutro Abs 01/15/2016 1998  1,500 - 7,800 cells/uL Final  . Lymphs Abs 01/15/2016 1332  850 - 3,900 cells/uL Final  . Monocytes Absolute 01/15/2016 296  200 - 950 cells/uL Final  . Eosinophils Absolute 01/15/2016 37  15 - 500 cells/uL Final  . Basophils Absolute 01/15/2016 37  0 - 200 cells/uL Final  . Neutrophils Relative % 01/15/2016 54  % Final  . Lymphocytes Relative 01/15/2016 36  % Final  . Monocytes Relative 01/15/2016 8  % Final  . Eosinophils Relative 01/15/2016 1  % Final  . Basophils Relative 01/15/2016 1  % Final  . Smear Review 01/15/2016 Criteria for review not met   Final   Past Medical History:  Diagnosis Date  . Colon polyp   . Hyperlipidemia   . Migraine    . SVT (supraventricular tachycardia) (HCC)    Past Surgical History:  Procedure Laterality Date  . ABDOMINAL HYSTERECTOMY     fibroids   Current Outpatient Prescriptions on File Prior to Visit  Medication Sig Dispense Refill  . OVER THE COUNTER MEDICATION [place]     No current facility-administered medications on file prior to visit.    Allergies  Allergen Reactions  . Codeine Nausea Only   Social History   Social History  . Marital status: Married    Spouse name: N/A  . Number of children: N/A  . Years of education: N/A   Occupational History  . Not on file.   Social History Main Topics  . Smoking status: Former Research scientist (life sciences)  . Smokeless tobacco: Not on file  . Alcohol use Yes     Comment: Rare  . Drug use: No  . Sexual activity: Yes     Comment: married to [place]   Other Topics Concern  . Not on file   Social History Narrative  . No narrative on file   Family History  Problem Relation Age of Onset  . Hyperlipidemia Father   . Diabetes Sister   . Cancer Sister 13    breast   . HIV Brother    Mom died suddenly at 35 either to myocardial infarction or possibly pulmonary embolism   Review of Systems  All other systems reviewed and are negative.      Objective:   Physical Exam  Constitutional: She is oriented to person,  place, and time. She appears well-developed and well-nourished. No distress.  HENT:  Head: Normocephalic and atraumatic.  Right Ear: External ear normal.  Left Ear: External ear normal.  Nose: Nose normal.  Mouth/Throat: Oropharynx is clear and moist. No oropharyngeal exudate.  Eyes: Conjunctivae and EOM are normal. Pupils are equal, round, and reactive to light. Right eye exhibits no discharge. Left eye exhibits no discharge. No scleral icterus.  Neck: Normal range of motion. Neck supple. No JVD present. No tracheal deviation present. No thyromegaly present.  Cardiovascular: Normal rate, regular rhythm, normal heart sounds and  intact distal pulses.  Exam reveals no gallop and no friction rub.   No murmur heard. Pulmonary/Chest: Effort normal and breath sounds normal. No stridor. No respiratory distress. She has no wheezes. She has no rales. She exhibits no tenderness.  Abdominal: Soft. Bowel sounds are normal. She exhibits no distension and no mass. There is no tenderness. There is no rebound and no guarding.  Musculoskeletal: Normal range of motion. She exhibits no edema, tenderness or deformity.  Lymphadenopathy:    She has no cervical adenopathy.  Neurological: She is alert and oriented to person, place, and time. She has normal reflexes. She displays normal reflexes. No cranial nerve deficit. She exhibits normal muscle tone. Coordination normal.  Skin: Skin is warm. No rash noted. She is not diaphoretic. No erythema. No pallor.  Psychiatric: She has a normal mood and affect. Her behavior is normal. Judgment and thought content normal.  Vitals reviewed.         Assessment & Plan:  Routine general medical examination at a health care facility  I reviewed her CBC, CMP, fasting lipid panel, and TSH all of which were excellent. Mammogram and colonoscopy are up-to-date. Gynecologist performs Pap smear. I would recommend bone density at 67. Offer the patient a flu shot but she declined at the present time. The remainder of her immunizations are up-to-date. I did recommend 1200 mg a day of calcium and 1000 units a day of vitamin D.

## 2016-07-11 ENCOUNTER — Other Ambulatory Visit: Payer: Self-pay | Admitting: Family Medicine

## 2016-07-11 DIAGNOSIS — Z1231 Encounter for screening mammogram for malignant neoplasm of breast: Secondary | ICD-10-CM

## 2016-07-14 ENCOUNTER — Ambulatory Visit (HOSPITAL_COMMUNITY)
Admission: RE | Admit: 2016-07-14 | Discharge: 2016-07-14 | Disposition: A | Discharge status: Home / Self Care | Source: Ambulatory Visit | Attending: Family Medicine | Admitting: Family Medicine

## 2016-07-14 ENCOUNTER — Encounter (HOSPITAL_COMMUNITY): Payer: Self-pay

## 2016-07-14 DIAGNOSIS — Z1231 Encounter for screening mammogram for malignant neoplasm of breast: Secondary | ICD-10-CM | POA: Diagnosis not present

## 2016-07-14 DIAGNOSIS — N6489 Other specified disorders of breast: Secondary | ICD-10-CM | POA: Diagnosis not present

## 2016-07-19 ENCOUNTER — Other Ambulatory Visit: Payer: Self-pay | Admitting: Family Medicine

## 2016-07-19 DIAGNOSIS — R928 Other abnormal and inconclusive findings on diagnostic imaging of breast: Secondary | ICD-10-CM

## 2016-07-26 ENCOUNTER — Ambulatory Visit (HOSPITAL_COMMUNITY)
Admission: RE | Admit: 2016-07-26 | Discharge: 2016-07-26 | Disposition: A | Discharge status: Home / Self Care | Source: Ambulatory Visit | Attending: Family Medicine | Admitting: Family Medicine

## 2016-07-26 DIAGNOSIS — R928 Other abnormal and inconclusive findings on diagnostic imaging of breast: Secondary | ICD-10-CM | POA: Diagnosis present

## 2017-05-30 ENCOUNTER — Encounter: Payer: Self-pay | Admitting: Internal Medicine

## 2017-07-07 ENCOUNTER — Ambulatory Visit (INDEPENDENT_AMBULATORY_CARE_PROVIDER_SITE_OTHER): Admitting: Family Medicine

## 2017-07-07 ENCOUNTER — Encounter: Payer: Self-pay | Admitting: Family Medicine

## 2017-07-07 VITALS — BP 134/72 | HR 102 | Temp 97.5°F | Resp 12 | Ht 72.0 in | Wt 162.0 lb

## 2017-07-07 DIAGNOSIS — M25521 Pain in right elbow: Secondary | ICD-10-CM | POA: Diagnosis not present

## 2017-07-07 DIAGNOSIS — G8929 Other chronic pain: Secondary | ICD-10-CM

## 2017-07-07 MED ORDER — MELOXICAM 15 MG PO TABS
15.0000 mg | ORAL_TABLET | Freq: Every day | ORAL | 0 refills | Status: DC
Start: 2017-07-07 — End: 2017-11-03

## 2017-07-07 NOTE — Progress Notes (Signed)
Subjective:    Patient ID: Sonia Lyons, female    DOB: Feb 19, 1956, 62 y.o.   MRN: [id number]  HPI Patient injured her right elbow in August.  She made several trips with her elbow fully extended carrying a heavy bucket full of water.  She felt a straining tear-like sensation in her elbow and immediately felt pain.  She had to stop what she was doing.  Ever since, she has had chronic mild pain in her right elbow.  The pain seems to be located near the lateral collateral ligament in the space between the olecranon process and the radial head.  It aches.  She is unable to fully extend the elbow in this area due to pain.  She is tried waiting 6 months without relief.  She has tried NSAIDs without relief.  There is no tenderness over the medial or lateral epicondyle today.  There is no tenderness to palpation over the radial head.  There is no tenderness to palpation over the olecranon process.  There is no crepitus in the left elbow.  There is no visible swelling erythema or bruising or effusion. Past Medical History:  Diagnosis Date  . Colon polyp   . Hyperlipidemia   . Migraine   . SVT (supraventricular tachycardia) ([place])    Current Outpatient Medications on File Prior to Visit  Medication Sig Dispense Refill  . MULTIPLE VITAMIN PO Take by mouth.     No current facility-administered medications on file prior to visit.    Allergies  Allergen Reactions  . Codeine Nausea Only   Social History   Socioeconomic History  . Marital status: Married    Spouse name: Not on file  . Number of children: Not on file  . Years of education: Not on file  . Highest education level: Not on file  Social Needs  . Financial resource strain: Not on file  . Food insecurity - worry: Not on file  . Food insecurity - inability: Not on file  . Transportation needs - medical: Not on file  . Transportation needs - non-medical: Not on file  Occupational History  . Not on file  Tobacco Use  . Smoking  status: Former Research scientist (life sciences)  . Smokeless tobacco: Never Used  Substance and Sexual Activity  . Alcohol use: Yes    Comment: Rare  . Drug use: No  . Sexual activity: Yes    Comment: married to [place]  Other Topics Concern  . Not on file  Social History Narrative  . Not on file      Review of Systems  All other systems reviewed and are negative.      Objective:   Physical Exam  Cardiovascular: Normal rate, regular rhythm and normal heart sounds.  Pulmonary/Chest: Effort normal and breath sounds normal.  Musculoskeletal:       Right elbow: She exhibits normal range of motion, no swelling, no effusion and no deformity. Tenderness found. Radial head tenderness noted. No medial epicondyle, no lateral epicondyle and no olecranon process tenderness noted.  Vitals reviewed.         Assessment & Plan:  Chronic elbow pain, right - Plan: Ambulatory referral to Orthopedic Surgery  I believe the patient has suffered a ligament tear in the elbow or could maybe have a loose bodies in the elbow causing instability and chronic pain.  Arthritis would be less likely on the differential diagnosis.  I will start the patient on meloxicam 15 mg daily and if no better the  next 2 weeks, I would recommend a referral to orthopedic surgery to determine if the patient would benefit from arthroscopy.  Given her inability to fully extend the elbow, I am concerned that she may have loose bodies within the elbow joint due to a cartilage tear that may benefit from arthroscopy.

## 2017-09-21 ENCOUNTER — Other Ambulatory Visit: Payer: Self-pay | Admitting: Family Medicine

## 2017-09-21 DIAGNOSIS — Z1231 Encounter for screening mammogram for malignant neoplasm of breast: Secondary | ICD-10-CM

## 2017-09-25 ENCOUNTER — Ambulatory Visit (HOSPITAL_COMMUNITY)
Admission: RE | Admit: 2017-09-25 | Discharge: 2017-09-25 | Disposition: A | Discharge status: Home / Self Care | Source: Ambulatory Visit | Attending: Family Medicine | Admitting: Family Medicine

## 2017-09-25 DIAGNOSIS — Z1231 Encounter for screening mammogram for malignant neoplasm of breast: Secondary | ICD-10-CM | POA: Insufficient documentation

## 2017-10-03 ENCOUNTER — Encounter: Payer: Self-pay | Admitting: Internal Medicine

## 2017-10-24 ENCOUNTER — Other Ambulatory Visit: Payer: Self-pay | Admitting: Family Medicine

## 2017-10-24 DIAGNOSIS — Z0000 Encounter for general adult medical examination without abnormal findings: Secondary | ICD-10-CM

## 2017-10-27 ENCOUNTER — Other Ambulatory Visit

## 2017-10-30 ENCOUNTER — Other Ambulatory Visit

## 2017-10-30 DIAGNOSIS — Z0000 Encounter for general adult medical examination without abnormal findings: Secondary | ICD-10-CM

## 2017-11-03 ENCOUNTER — Ambulatory Visit (INDEPENDENT_AMBULATORY_CARE_PROVIDER_SITE_OTHER): Admitting: Family Medicine

## 2017-11-03 ENCOUNTER — Encounter: Payer: Self-pay | Admitting: Family Medicine

## 2017-11-03 VITALS — BP 130/72 | HR 78 | Temp 98.1°F | Resp 14 | Ht 72.0 in | Wt 163.0 lb

## 2017-11-03 DIAGNOSIS — Z114 Encounter for screening for human immunodeficiency virus [HIV]: Secondary | ICD-10-CM | POA: Diagnosis not present

## 2017-11-03 DIAGNOSIS — Z1159 Encounter for screening for other viral diseases: Secondary | ICD-10-CM

## 2017-11-03 DIAGNOSIS — Z0000 Encounter for general adult medical examination without abnormal findings: Secondary | ICD-10-CM

## 2017-11-03 NOTE — Progress Notes (Signed)
Subjective:    Patient ID: Sonia Lyons, female    DOB: 05-09-1956, 62 y.o.   MRN: [id number]  HPI Patient is here today for complete physical exam.  She has no major medical concerns.  Her last mammogram was in May and was normal.  She has a history of a hysterectomy although she still has her ovaries.  This was performed in her early 53s.  Therefore she does not require Pap smear.  Her colonoscopy is due however she has already scheduled this herself and it should be completed in the next month.  Immunizations are up-to-date.  She is due for hepatitis C and HIV screening.  She request that I do this.  I will try to see if we can add this to her original blood work. Appointment on 10/30/2017  Component Date Value Ref Range Status  . WBC 10/30/2017 4.1  3.8 - 10.8 Thousand/uL Final  . RBC 10/30/2017 4.29  3.80 - 5.10 Million/uL Final  . Hemoglobin 10/30/2017 13.3  11.7 - 15.5 g/dL Final  . HCT 10/30/2017 39.0  35.0 - 45.0 % Final  . MCV 10/30/2017 90.9  80.0 - 100.0 fL Final  . MCH 10/30/2017 31.0  27.0 - 33.0 pg Final  . MCHC 10/30/2017 34.1  32.0 - 36.0 g/dL Final  . RDW 10/30/2017 12.3  11.0 - 15.0 % Final  . Platelets 10/30/2017 262  140 - 400 Thousand/uL Final  . MPV 10/30/2017 10.4  7.5 - 12.5 fL Final  . Neutro Abs 10/30/2017 2,194  1,500 - 7,800 cells/uL Final  . Lymphs Abs 10/30/2017 1,398  850 - 3,900 cells/uL Final  . WBC mixed population 10/30/2017 418  200 - 950 cells/uL Final  . Eosinophils Absolute 10/30/2017 62  15 - 500 cells/uL Final  . Basophils Absolute 10/30/2017 29  0 - 200 cells/uL Final  . Neutrophils Relative % 10/30/2017 53.5  % Final  . Total Lymphocyte 10/30/2017 34.1  % Final  . Monocytes Relative 10/30/2017 10.2  % Final  . Eosinophils Relative 10/30/2017 1.5  % Final  . Basophils Relative 10/30/2017 0.7  % Final  . Glucose, Bld 10/30/2017 86  65 - 99 mg/dL Final   Comment: .            Fasting reference interval .   . BUN 10/30/2017 13  7 - 25  mg/dL Final  . Creat 10/30/2017 0.90  0.50 - 1.05 mg/dL Final   Comment: For patients >64 years of age, the reference limit for Creatinine is approximately 13% higher for people identified as African-American. .   Havery Moros Ratio 36/64/4034 NOT APPLICABLE  6 - 22 (calc) Final  . Sodium 10/30/2017 140  135 - 146 mmol/L Final  . Potassium 10/30/2017 4.4  3.5 - 5.3 mmol/L Final  . Chloride 10/30/2017 104  98 - 110 mmol/L Final  . CO2 10/30/2017 28  20 - 32 mmol/L Final  . Calcium 10/30/2017 9.9  8.6 - 10.4 mg/dL Final  . Total Protein 10/30/2017 6.9  6.1 - 8.1 g/dL Final  . Albumin 10/30/2017 4.6  3.6 - 5.1 g/dL Final  . Globulin 10/30/2017 2.3  1.9 - 3.7 g/dL (calc) Final  . AG Ratio 10/30/2017 2.0  1.0 - 2.5 (calc) Final  . Total Bilirubin 10/30/2017 0.8  0.2 - 1.2 mg/dL Final  . Alkaline phosphatase (APISO) 10/30/2017 68  33 - 130 U/L Final  . AST 10/30/2017 21  10 - 35 U/L Final  . ALT 10/30/2017 15  6 - 29 U/L Final  . Cholesterol 10/30/2017 223* <200 mg/dL Final  . HDL 10/30/2017 71  >50 mg/dL Final  . Triglycerides 10/30/2017 94  <150 mg/dL Final  . LDL Cholesterol (Calc) 10/30/2017 132* mg/dL (calc) Final   Comment: Reference range: <100 . Desirable range <100 mg/dL for primary prevention;   <70 mg/dL for patients with CHD or diabetic patients  with > or = 2 CHD risk factors. Marland Kitchen LDL-C is now calculated using the Martin-Hopkins  calculation, which is a validated novel method providing  better accuracy than the Friedewald equation in the  estimation of LDL-C.  Cresenciano Genre et al. Annamaria Helling. 4268;341(96): 2061-2068  (http://education.[facility].com/faq/FAQ164)   . Total CHOL/HDL Ratio 10/30/2017 3.1  <5.0 (calc) Final  . Non-HDL Cholesterol (Calc) 10/30/2017 152* <130 mg/dL (calc) Final   Comment: For patients with diabetes plus 1 major ASCVD risk  factor, treating to a non-HDL-C goal of <100 mg/dL  (LDL-C of <70 mg/dL) is considered a therapeutic  option.   . TSH  10/30/2017 0.90  0.40 - 4.50 mIU/L Final   Past Medical History:  Diagnosis Date  . Colon polyp   . Hyperlipidemia   . Migraine   . SVT (supraventricular tachycardia) ([place])     Current Outpatient Medications on File Prior to Visit  Medication Sig Dispense Refill  . MULTIPLE VITAMIN PO Take by mouth.     No current facility-administered medications on file prior to visit.    Allergies  Allergen Reactions  . Codeine Nausea Only   Social History   Socioeconomic History  . Marital status: Married    Spouse name: Not on file  . Number of children: Not on file  . Years of education: Not on file  . Highest education level: Not on file  Occupational History  . Not on file  Social Needs  . Financial resource strain: Not on file  . Food insecurity:    Worry: Not on file    Inability: Not on file  . Transportation needs:    Medical: Not on file    Non-medical: Not on file  Tobacco Use  . Smoking status: Former Research scientist (life sciences)  . Smokeless tobacco: Never Used  Substance and Sexual Activity  . Alcohol use: Yes    Comment: Rare  . Drug use: No  . Sexual activity: Yes    Comment: married to [place]  . Physical activity:    Days per week: Not on file    Minutes per session: Not on file  . Stress: Not on file  Relationships  . Social connections:    Talks on phone: Not on file    Gets together: Not on file    Attends religious service: Not on file    Active member of club or organization: Not on file    Attends meetings of clubs or organizations: Not on file    Relationship status: Not on file  . Intimate partner violence:    Fear of current or ex partner: Not on file    Emotionally abused: Not on file    Physically abused: Not on file    Forced sexual activity: Not on file  Other Topics Concern  . Not on file  Social History Narrative  . Not on file   Family History  Problem Relation Age of Onset  . Hyperlipidemia Father   . Diabetes Sister   . Cancer Sister  80       breast   . HIV Brother  Mom died suddenly at 35 either to myocardial infarction or possibly pulmonary embolism   Review of Systems  All other systems reviewed and are negative.      Objective:   Physical Exam  Constitutional: She is oriented to person, place, and time. She appears well-developed and well-nourished. No distress.  HENT:  Head: Normocephalic and atraumatic.  Right Ear: External ear normal.  Left Ear: External ear normal.  Nose: Nose normal.  Mouth/Throat: Oropharynx is clear and moist. No oropharyngeal exudate.  Eyes: Pupils are equal, round, and reactive to light. Conjunctivae and EOM are normal. Right eye exhibits no discharge. Left eye exhibits no discharge. No scleral icterus.  Neck: Normal range of motion. Neck supple. No JVD present. No tracheal deviation present. No thyromegaly present.  Cardiovascular: Normal rate, regular rhythm, normal heart sounds and intact distal pulses. Exam reveals no gallop and no friction rub.  No murmur heard. Pulmonary/Chest: Effort normal and breath sounds normal. No stridor. No respiratory distress. She has no wheezes. She has no rales. She exhibits no tenderness.  Abdominal: Soft. Bowel sounds are normal. She exhibits no distension and no mass. There is no tenderness. There is no rebound and no guarding.  Musculoskeletal: Normal range of motion. She exhibits no edema, tenderness or deformity.  Lymphadenopathy:    She has no cervical adenopathy.  Neurological: She is alert and oriented to person, place, and time. She has normal reflexes. No cranial nerve deficit. She exhibits normal muscle tone. Coordination normal.  Skin: Skin is warm. No rash noted. She is not diaphoretic. No erythema. No pallor.  Psychiatric: She has a normal mood and affect. Her behavior is normal. Judgment and thought content normal.  Vitals reviewed.         Assessment & Plan:  Encounter for hepatitis C screening test for low risk patient -  Plan: Hepatitis C Antibody  Encounter for screening for HIV - Plan: HIV antibody (with reflex)  Routine general medical examination at a health care facility  Lab work is excellent.  I calculated her 10-year risk of cardiovascular disease to be 2.8%.  Therefore I would not recommend a statin medication.  Immunizations are up-to-date.  I would recommend the shingles vaccine at 62.  I will try to screen the patient for HIV and hepatitis C.  Mammogram is up-to-date.  Colonoscopy is already been scheduled.  Pap smear is not necessary.  Regular anticipatory guidance is provided.

## 2017-11-07 ENCOUNTER — Ambulatory Visit (INDEPENDENT_AMBULATORY_CARE_PROVIDER_SITE_OTHER): Payer: Self-pay

## 2017-11-07 DIAGNOSIS — Z86010 Personal history of colonic polyps: Secondary | ICD-10-CM

## 2017-11-07 LAB — CBC WITH DIFFERENTIAL/PLATELET
BASOS PCT: 0.7 %
Basophils Absolute: 29 cells/uL (ref 0–200)
EOS ABS: 62 {cells}/uL (ref 15–500)
Eosinophils Relative: 1.5 %
HCT: 39 % (ref 35.0–45.0)
HEMOGLOBIN: 13.3 g/dL (ref 11.7–15.5)
LYMPHS ABS: 1398 {cells}/uL (ref 850–3900)
MCH: 31 pg (ref 27.0–33.0)
MCHC: 34.1 g/dL (ref 32.0–36.0)
MCV: 90.9 fL (ref 80.0–100.0)
MONOS PCT: 10.2 %
MPV: 10.4 fL (ref 7.5–12.5)
NEUTROS ABS: 2194 {cells}/uL (ref 1500–7800)
Neutrophils Relative %: 53.5 %
Platelets: 262 10*3/uL (ref 140–400)
RBC: 4.29 10*6/uL (ref 3.80–5.10)
RDW: 12.3 % (ref 11.0–15.0)
Total Lymphocyte: 34.1 %
WBC mixed population: 418 cells/uL (ref 200–950)
WBC: 4.1 10*3/uL (ref 3.8–10.8)

## 2017-11-07 LAB — LIPID PANEL
Cholesterol: 223 mg/dL — ABNORMAL HIGH (ref <200)
HDL: 71 mg/dL (ref >50)
LDL Cholesterol (Calc): 132 mg/dL (calc) — ABNORMAL HIGH
NON-HDL CHOLESTEROL (CALC): 152 mg/dL — AB (ref <130)
Total CHOL/HDL Ratio: 3.1 (calc) (ref <5.0)
Triglycerides: 94 mg/dL (ref <150)

## 2017-11-07 LAB — COMPREHENSIVE METABOLIC PANEL
AG RATIO: 2 (calc) (ref 1.0–2.5)
ALT: 15 U/L (ref 6–29)
AST: 21 U/L (ref 10–35)
Albumin: 4.6 g/dL (ref 3.6–5.1)
Alkaline phosphatase (APISO): 68 U/L (ref 33–130)
BUN: 13 mg/dL (ref 7–25)
CHLORIDE: 104 mmol/L (ref 98–110)
CO2: 28 mmol/L (ref 20–32)
Calcium: 9.9 mg/dL (ref 8.6–10.4)
Creat: 0.9 mg/dL (ref 0.50–1.05)
GLOBULIN: 2.3 g/dL (ref 1.9–3.7)
GLUCOSE: 86 mg/dL (ref 65–99)
Potassium: 4.4 mmol/L (ref 3.5–5.3)
Sodium: 140 mmol/L (ref 135–146)
Total Bilirubin: 0.8 mg/dL (ref 0.2–1.2)
Total Protein: 6.9 g/dL (ref 6.1–8.1)

## 2017-11-07 LAB — TEST AUTHORIZATION

## 2017-11-07 LAB — HEPATITIS C ANTIBODY
Hepatitis C Ab: NONREACTIVE
SIGNAL TO CUT-OFF: 0.01 (ref <1.00)

## 2017-11-07 LAB — TSH: TSH: 0.9 mIU/L (ref 0.40–4.50)

## 2017-11-07 LAB — HIV ANTIBODY (ROUTINE TESTING W REFLEX): HIV: NONREACTIVE

## 2017-11-07 MED ORDER — NA SULFATE-K SULFATE-MG SULF 17.5-3.13-1.6 GM/177ML PO SOLN: 1.0000 | ORAL | 0 refills | Status: DC

## 2017-11-07 NOTE — Progress Notes (Signed)
Gastroenterology Pre-Procedure Review  Request Date:11/07/17 Requesting Physician: 7 year recall- (last tcs 07/28/10 RMR tubular adenoma)  PATIENT REVIEW QUESTIONS: The patient responded to the following health history questions as indicated:    1. Diabetes Melitis: no 2. Joint replacements in the past 12 months: no 3. Major health problems in the past 3 months: no 4. Has an artificial valve or MVP: no 5. Has a defibrillator: no 6. Has been advised in past to take antibiotics in advance of a procedure like teeth cleaning: no 7. Family history of colon cancer: yes (maternal grandparent )  32. Alcohol Use: no 9. History of sleep apnea: no  10. History of coronary artery or other vascular stents placed within the last 12 months: no 11. History of any prior anesthesia complications: yes (sometimes will vomit after surgery )    MEDICATIONS & ALLERGIES:    Patient reports the following regarding taking any blood thinners:   Plavix? no Aspirin? no Coumadin? no Brilinta? no Xarelto? no Eliquis? no Pradaxa? no Savaysa? no Effient? no  Patient confirms/reports the following medications:  Current Outpatient Medications  Medication Sig Dispense Refill  . MULTIPLE VITAMIN PO Take by mouth.    Marland Kitchen UNABLE TO FIND daily. Med Name: Barnett Hatter herbal med for hot flashes     No current facility-administered medications for this visit.     Patient confirms/reports the following allergies:  Allergies  Allergen Reactions  . Codeine Nausea Only    No orders of the defined types were placed in this encounter.   AUTHORIZATION INFORMATION Primary Insurance: tricare,  ID #: [id number] Pre-Cert / Josem Kaufmann required:  Pre-Cert / Auth #:   SCHEDULE INFORMATION: Procedure has been scheduled as follows:  Date:01/05/18 , Time:9:45  Location: [facility]  This Gastroenterology Pre-Precedure Review Form is being routed to the following provider(s): [facility]

## 2017-11-07 NOTE — Patient Instructions (Addendum)
Sonia Lyons  [birth year] MRN: [id number]     Procedure Date: 02/09/18 Time to register: 6:30am Place to register: [facility] Procedure Time: 7:30am Scheduled provider: R. Garfield Cornea, MD    PREPARATION FOR COLONOSCOPY WITH SUPREP BOWEL PREP KIT  Note: Suprep Bowel Prep Kit is a split-dose (2day) regimen. Consumption of BOTH 6-ounce bottles is required for a complete prep.  Please notify us immediately if you are diabetic, take iron supplements, or if you are on Coumadin or any other blood thinners.                                                                                                                                                    2 DAYS BEFORE PROCEDURE:  DATE: 02/07/18   DAY: Wednesday Begin clear liquid diet AFTER your lunch meal. NO SOLID FOODS after this point.  1 DAY BEFORE PROCEDURE:  DATE: 02/08/18   DAY: Thursday Continue clear liquids the entire day - NO SOLID FOOD.     At 6:00pm: Complete steps 1 through 4 below, using ONE (1) 6-ounce bottle, before going to bed. Step 1:  Pour ONE (1) 6-ounce bottle of SUPREP liquid into the mixing container.  Step 2:  Add cool drinking water to the 16 ounce line on the container and mix.  Note: Dilute the solution concentrate as directed prior to use. Step 3:  DRINK ALL the liquid in the container. Step 4:  You MUST drink an additional two (2) or more 16 ounce containers of water  over the next one (1) hour.   Continue clear liquids only EXCEPTION: If you take medications for your heart, blood pressure, or breathing, you may take these medications with a small amount of clear liquid.   DAY OF PROCEDURE:   DATE: 02/09/18   DAY: Friday    5 hours before your procedure at :2:30am Step 1:  Pour ONE (1) 6-ounce bottle of SUPREP liquid into the mixing container.  Step 2:  Add cool drinking water to the 16 ounce line on the container and mix.  Note: Dilute the solution concentrate as directed prior to  use. Step 3:  DRINK ALL the liquid in the container. Step 4:  You MUST drink an additional two (2) or more 16 ounce containers of water  over the next one (1) hour. You MUST complete the final glass of water at least  3 hours before your colonoscopy.   Nothing by mouth past 4:30am  You may take your morning medications with sip of water unless we have instructed otherwise.    Please see below for Dietary Information.  CLEAR LIQUIDS INCLUDE:  Water Jello (NOT red in color)   Ice Popsicles (NOT red in color)   Tea (sugar ok, no milk/cream) Powdered fruit flavored drinks  Coffee (sugar ok, no milk/cream) Gatorade/  Lemonade/ Kool-Aid  (NOT red in color)   Juice: apple, white grape, white cranberry Soft drinks  Clear bullion, consomme, broth (fat free beef/chicken/vegetable)  Carbonated beverages (any kind)  Strained chicken noodle soup Hard Candy   Remember: Clear liquids are liquids that will allow you to see your fingers on the other side of a clear glass. Be sure liquids are NOT red in color, and not cloudy, but CLEAR.  DO NOT EAT OR DRINK ANY OF THE FOLLOWING:  Dairy products of any kind   Cranberry juice Tomato juice / V8 juice   Grapefruit juice Orange juice     Red grape juice  Do not eat any solid foods, including such foods as: cereal, oatmeal, yogurt, fruits, vegetables, creamed soups, eggs, bread, crackers, pureed foods in a blender, etc.   HELPFUL HINTS FOR DRINKING PREP SOLUTION:   Make sure prep is extremely cold. Mix and refrigerate the the morning of the prep. You may also put in the freezer.   You may try mixing some Crystal Light or Country Time Lemonade if you prefer. Mix in small amounts; add more if necessary.  Try drinking through a straw  Rinse mouth with water or a mouthwash between glasses, to remove after-taste.  Try sipping on a cold beverage /ice/ popsicles between glasses of prep.  Place a piece of sugar-free hard candy in mouth between  glasses.  If you become nauseated, try consuming smaller amounts, or stretch out the time between glasses. Stop for 30-60 minutes, then slowly start back drinking.     OTHER INSTRUCTIONS  You will need a responsible adult at least 60 years of age to accompany you and drive you home. This person must remain in the waiting room during your procedure. The hospital will cancel your procedure if you do not have a responsible adult with you.   1. Wear loose fitting clothing that is easily removed. 2. Leave jewelry and other valuables at home.  3. Remove all body piercing jewelry and leave at home. 4. Total time from sign-in until discharge is approximately 2-3 hours. 5. You should go home directly after your procedure and rest. You can resume normal activities the day after your procedure. 6. The day of your procedure you should not:  Drive  Make legal decisions  Operate machinery  Drink alcohol  Return to work   You may call the office (Dept: [phone]) before 5:00pm, or page the doctor on call [phone]) after 5:00pm, for further instructions, if necessary.   Insurance Information YOU WILL NEED TO CHECK WITH YOUR INSURANCE COMPANY FOR THE BENEFITS OF COVERAGE YOU HAVE FOR THIS PROCEDURE.  UNFORTUNATELY, NOT ALL INSURANCE COMPANIES HAVE BENEFITS TO COVER ALL OR PART OF THESE TYPES OF PROCEDURES.  IT IS YOUR RESPONSIBILITY TO CHECK YOUR BENEFITS, HOWEVER, WE WILL BE GLAD TO ASSIST YOU WITH ANY CODES YOUR INSURANCE COMPANY MAY NEED.    PLEASE NOTE THAT MOST INSURANCE COMPANIES WILL NOT COVER A SCREENING COLONOSCOPY FOR PEOPLE UNDER THE AGE OF 50  IF YOU HAVE BCBS INSURANCE, YOU MAY HAVE BENEFITS FOR A SCREENING COLONOSCOPY BUT IF POLYPS ARE FOUND THE DIAGNOSIS WILL CHANGE AND THEN YOU MAY HAVE A DEDUCTIBLE THAT WILL NEED TO BE MET. SO PLEASE MAKE SURE YOU CHECK YOUR BENEFITS FOR A SCREENING COLONOSCOPY AS WELL AS A DIAGNOSTIC COLONOSCOPY.

## 2017-11-17 NOTE — Progress Notes (Signed)
Ok to schedule.

## 2017-12-08 NOTE — Progress Notes (Signed)
Pt called- she just found out she is having elbow surgery the week before her tcs and doesn't want to have them both done at the same time. We have moved her to 02/09/18 at 7:30am. Called and changed dates with Hoyle Sauer. New instructions done and mailed to the pt.  Her husband will be bringing her and he will have to leave and take their son to school. I called and asked Hoyle Sauer about this and she said it was fine as long as he checks her in and leaves a number where he can be reached and checks back in with the desk as soon as he can. Pt is aware of this.

## 2017-12-26 HISTORY — PX: OTHER SURGICAL HISTORY: SHX169

## 2018-02-09 ENCOUNTER — Encounter (HOSPITAL_COMMUNITY): Payer: Self-pay | Admitting: *Deleted

## 2018-02-09 ENCOUNTER — Ambulatory Visit (HOSPITAL_COMMUNITY)
Admission: RE | Admit: 2018-02-09 | Discharge: 2018-02-09 | Disposition: A | Discharge status: Home / Self Care | Source: Ambulatory Visit | Attending: Internal Medicine | Admitting: Internal Medicine

## 2018-02-09 ENCOUNTER — Other Ambulatory Visit: Payer: Self-pay

## 2018-02-09 ENCOUNTER — Encounter (HOSPITAL_COMMUNITY)
Admission: RE | Disposition: A | Discharge status: Home / Self Care | Payer: Self-pay | Source: Ambulatory Visit | Attending: Internal Medicine

## 2018-02-09 DIAGNOSIS — E785 Hyperlipidemia, unspecified: Secondary | ICD-10-CM | POA: Insufficient documentation

## 2018-02-09 DIAGNOSIS — Z1211 Encounter for screening for malignant neoplasm of colon: Secondary | ICD-10-CM | POA: Diagnosis present

## 2018-02-09 DIAGNOSIS — Z79899 Other long term (current) drug therapy: Secondary | ICD-10-CM | POA: Diagnosis not present

## 2018-02-09 DIAGNOSIS — Z87891 Personal history of nicotine dependence: Secondary | ICD-10-CM | POA: Diagnosis not present

## 2018-02-09 DIAGNOSIS — Z86010 Personal history of colonic polyps: Secondary | ICD-10-CM | POA: Insufficient documentation

## 2018-02-09 DIAGNOSIS — K5730 Diverticulosis of large intestine without perforation or abscess without bleeding: Secondary | ICD-10-CM | POA: Insufficient documentation

## 2018-02-09 HISTORY — PX: COLONOSCOPY: SHX5424

## 2018-02-09 SURGERY — COLONOSCOPY
Anesthesia: Moderate Sedation

## 2018-02-09 MED ORDER — SODIUM CHLORIDE 0.9 % IV SOLN
INTRAVENOUS | Status: DC
  Administered 2018-02-09: 07:00:00 via INTRAVENOUS

## 2018-02-09 MED ORDER — MIDAZOLAM HCL 5 MG/5ML IJ SOLN
INTRAMUSCULAR | Status: DC | PRN
  Administered 2018-02-09: 2 mg via INTRAVENOUS
  Administered 2018-02-09: 1 mg via INTRAVENOUS
  Administered 2018-02-09: 2 mg via INTRAVENOUS
  Administered 2018-02-09 (×2): 1 mg via INTRAVENOUS

## 2018-02-09 MED ORDER — MEPERIDINE HCL 100 MG/ML IJ SOLN
INTRAMUSCULAR | Status: DC | PRN
  Administered 2018-02-09: 15 mg via INTRAVENOUS
  Administered 2018-02-09: 25 mg via INTRAVENOUS

## 2018-02-09 MED ORDER — ONDANSETRON HCL 4 MG/2ML IJ SOLN
INTRAMUSCULAR | Status: DC | PRN
  Administered 2018-02-09: 4 mg via INTRAVENOUS

## 2018-02-09 MED ORDER — MIDAZOLAM HCL 5 MG/5ML IJ SOLN
INTRAMUSCULAR | Status: AC
  Filled 2018-02-09: qty 5

## 2018-02-09 MED ORDER — ONDANSETRON HCL 4 MG/2ML IJ SOLN
INTRAMUSCULAR | Status: AC
  Filled 2018-02-09: qty 2

## 2018-02-09 MED ORDER — MEPERIDINE HCL 50 MG/ML IJ SOLN
INTRAMUSCULAR | Status: AC
  Filled 2018-02-09: qty 1

## 2018-02-09 NOTE — H&P (Signed)
_0 @   Primary Care Physician:  Susy Frizzle, MD Primary Gastroenterologist:  Dr. Gala Romney  Pre-Procedure History & Physical: HPI:  Sonia Lyons is a 62 y.o. female here for surveillance colonoscopy. History colonic adenoma.  Past Medical History:  Diagnosis Date  . Colon polyp   . Hyperlipidemia   . Migraine    Migraines  . SVT (supraventricular tachycardia) (HCC)     Past Surgical History:  Procedure Laterality Date  . ABDOMINAL HYSTERECTOMY     fibroids  . Bone marrow removed from both hips    . COLONOSCOPY    . Left wrist surgery    . Right elbow surgery  12/26/2017  . SINUS SURGERY WITH INSTATRAK      Prior to Admission medications   Medication Sig Start Date End Date Taking? Authorizing Provider  MULTIPLE VITAMIN PO Take 1 tablet by mouth daily.    Yes [provider]  Na Sulfate-K Sulfate-Mg Sulf (SUPREP BOWEL PREP KIT) 17.5-3.13-1.6 GM/177ML SOLN Take 1 kit by mouth as directed. 11/07/17  Yes Annitta Needs, NP    Allergies as of 11/07/2017 - Review Complete 11/07/2017  Allergen Reaction Noted  . Codeine Nausea Only 05/13/2013    Family History  Problem Relation Age of Onset  . Hyperlipidemia Father   . Diabetes Sister   . Cancer Sister 66       breast   . HIV Brother     Social History   Socioeconomic History  . Marital status: Married    Spouse name: Not on file  . Number of children: Not on file  . Years of education: Not on file  . Highest education level: Not on file  Occupational History  . Not on file  Social Needs  . Financial resource strain: Not on file  . Food insecurity:    Worry: Not on file    Inability: Not on file  . Transportation needs:    Medical: Not on file    Non-medical: Not on file  Tobacco Use  . Smoking status: Former Research scientist (life sciences)  . Smokeless tobacco: Never Used  Substance and Sexual Activity  . Alcohol use: Yes    Comment: Rare  . Drug use: No  . Sexual activity: Yes    Comment: married to [place]  . Physical activity:    Days per week: Not on file    Minutes per session: Not on file  . Stress: Not on file  Relationships  . Social connections:    Talks on phone: Not on file    Gets together: Not on file    Attends religious service: Not on file    Active member of club or organization: Not on file    Attends meetings of clubs or organizations: Not on file    Relationship status: Not on file  . Intimate partner violence:    Fear of current or ex partner: Not on file    Emotionally abused: Not on file    Physically abused: Not on file    Forced sexual activity: Not on file  Other Topics Concern  . Not on file  Social History Narrative  . Not on file    Review of Systems: See HPI, otherwise negative ROS  Physical Exam: BP 122/71   Pulse 75   Temp 98 F (36.7 C) (Oral)   Resp (!) 21   Ht 6' (1.829 m)   Wt 71.7 kg   SpO2 100%   BMI 21.43  kg/m  General:   Alert,  Well-developed, well-nourished, pleasant and cooperative in NAD Neck:  Supple; no masses or thyromegaly. No significant cervical adenopathy. Lungs:  Clear throughout to auscultation.   No wheezes, crackles, or rhonchi. No acute distress. Heart:  Regular rate and rhythm; no murmurs, clicks, rubs,  or gallops. Abdomen: Non-distended, normal bowel sounds.  Soft and nontender without appreciable mass or hepatosplenomegaly.  Pulses:  Normal pulses noted. Extremities:  Without clubbing or edema.  Impression/Plan:  62-year-old lady here for surveillance colonoscopy. History of colonic adenoma.   The risks, benefits, limitations, alternatives and imponderables have been reviewed with the patient. Questions have been answered. All parties are agreeable.      Notice: This dictation was prepared with Dragon dictation along with smaller phrase technology. Any transcriptional errors that result from this process are unintentional and may not be corrected upon review. 

## 2018-02-09 NOTE — Discharge Instructions (Signed)
°Colonoscopy °Discharge Instructions ° °Read the instructions outlined below and refer to this sheet in the next few weeks. These discharge instructions provide you with general information on caring for yourself after you leave the hospital. Your doctor may also give you specific instructions. While your treatment has been planned according to the most current medical practices available, unavoidable complications occasionally occur. If you have any problems or questions after discharge, call Dr. Rourk at [phone]. °ACTIVITY °· You may resume your regular activity, but move at a slower pace for the next 24 hours.  °· Take frequent rest periods for the next 24 hours.  °· Walking will help get rid of the air and reduce the bloated feeling in your belly (abdomen).  °· No driving for 24 hours (because of the medicine (anesthesia) used during the test).   °· Do not sign any important legal documents or operate any machinery for 24 hours (because of the anesthesia used during the test).  °NUTRITION °· Drink plenty of fluids.  °· You may resume your normal diet as instructed by your doctor.  °· Begin with a light meal and progress to your normal diet. Heavy or fried foods are harder to digest and may make you feel sick to your stomach (nauseated).  °· Avoid alcoholic beverages for 24 hours or as instructed.  °MEDICATIONS °· You may resume your normal medications unless your doctor tells you otherwise.  °WHAT YOU CAN EXPECT TODAY °· Some feelings of bloating in the abdomen.  °· Passage of more gas than usual.  °· Spotting of blood in your stool or on the toilet paper.  °IF YOU HAD POLYPS REMOVED DURING THE COLONOSCOPY: °· No aspirin products for 7 days or as instructed.  °· No alcohol for 7 days or as instructed.  °· Eat a soft diet for the next 24 hours.  °FINDING OUT THE RESULTS OF YOUR TEST °Not all test results are available during your visit. If your test results are not back during the visit, make an appointment  with your caregiver to find out the results. Do not assume everything is normal if you have not heard from your caregiver or the medical facility. It is important for you to follow up on all of your test results.  °SEEK IMMEDIATE MEDICAL ATTENTION IF: °· You have more than a spotting of blood in your stool.  °· Your belly is swollen (abdominal distention).  °· You are nauseated or vomiting.  °· You have a temperature over 101.  °· You have abdominal pain or discomfort that is severe or gets worse throughout the day.  ° ° °Diverticulosis information provided ° °Repeat colonoscopy for screening purposes in 10 years ° ° °Diverticulosis °Diverticulosis is a condition that develops when small pouches (diverticula) form in the wall of the large intestine (colon). The colon is where water is absorbed and stool is formed. The pouches form when the inside layer of the colon pushes through weak spots in the outer layers of the colon. You may have a few pouches or many of them. °What are the causes? °The cause of this condition is not known. °What increases the risk? °The following factors may make you more likely to develop this condition: °· Being older than age 60. Your risk for this condition increases with age. Diverticulosis is rare among people younger than age 30. By age 80, many people have it. °· Eating a low-fiber diet. °· Having frequent constipation. °· Being overweight. °· Not getting enough   exercise.  Smoking.  Taking over-the-counter pain medicines, like aspirin and ibuprofen.  Having a family history of diverticulosis.  What are the signs or symptoms? In most people, there are no symptoms of this condition. If you do have symptoms, they may include:  Bloating.  Cramps in the abdomen.  Constipation or diarrhea.  Pain in the lower left side of the abdomen.  How is this diagnosed? This condition is most often diagnosed during an exam for other colon problems. Because diverticulosis usually has  no symptoms, it often cannot be diagnosed independently. This condition may be diagnosed by:  Using a flexible scope to examine the colon (colonoscopy).  Taking an X-ray of the colon after dye has been put into the colon (barium enema).  Doing a CT scan.  How is this treated? You may not need treatment for this condition if you have never developed an infection related to diverticulosis. If you have had an infection before, treatment may include:  Eating a high-fiber diet. This may include eating more fruits, vegetables, and grains.  Taking a fiber supplement.  Taking a live bacteria supplement (probiotic).  Taking medicine to relax your colon.  Taking antibiotic medicines.  Follow these instructions at home:  Drink 6-8 glasses of water or more each day to prevent constipation.  Try not to strain when you have a bowel movement.  If you have had an infection before: ? Eat more fiber as directed by your health care provider or your diet and nutrition specialist (dietitian). ? Take a fiber supplement or probiotic, if your health care provider approves.  Take over-the-counter and prescription medicines only as told by your health care provider.  If you were prescribed an antibiotic, take it as told by your health care provider. Do not stop taking the antibiotic even if you start to feel better.  Keep all follow-up visits as told by your health care provider. This is important. Contact a health care provider if:  You have pain in your abdomen.  You have bloating.  You have cramps.  You have not had a bowel movement in 3 days. Get help right away if:  Your pain gets worse.  Your bloating becomes very bad.  You have a fever or chills, and your symptoms suddenly get worse.  You vomit.  You have bowel movements that are bloody or black.  You have bleeding from your rectum. Summary  Diverticulosis is a condition that develops when small pouches (diverticula) form in  the wall of the large intestine (colon).  You may have a few pouches or many of them.  This condition is most often diagnosed during an exam for other colon problems.  If you have had an infection related to diverticulosis, treatment may include increasing the fiber in your diet, taking supplements, or taking medicines. This information is not intended to replace advice given to you by your health care provider. Make sure you discuss any questions you have with your health care provider.

## 2018-02-09 NOTE — Op Note (Signed)
[facility] Patient Name: Sonia Lyons Procedure Date: 02/09/2018 7:26 AM MRN: [id number] Date of Birth: June 23, 1955 Attending MD: Norvel Richards , MD CSN: [id number] Age: 62 Admit Type: Outpatient Procedure:                Colonoscopy Indications:              High risk colon cancer surveillance: Personal                            history of colonic polyps Providers:                Norvel Richards, MD, Janeece Riggers, RN, Nelma Rothman, Technician Referring MD:              Medicines:                Meperidine 40 mg IV, Midazolam 7 mg IV, Ondansetron                            4 mg IV Complications:            No immediate complications. Estimated Blood Loss:     Estimated blood loss: none. Procedure:                Pre-Anesthesia Assessment:                           - Prior to the procedure, a History and Physical                            was performed, and patient medications and                            allergies were reviewed. The patient's tolerance of                            previous anesthesia was also reviewed. The risks                            and benefits of the procedure and the sedation                            options and risks were discussed with the patient.                            All questions were answered, and informed consent                            was obtained. Prior Anticoagulants: The patient has                            taken no previous anticoagulant or antiplatelet  agents. ASA Grade Assessment: II - A patient with                            mild systemic disease. After reviewing the risks                            and benefits, the patient was deemed in                            satisfactory condition to undergo the procedure.                           After obtaining informed consent, the colonoscope                            was passed under direct vision. Throughout  the                            procedure, the patient's blood pressure, pulse, and                            oxygen saturations were monitored continuously. The                            [id number]-[id number] ([id number]) scope was introduced through                            the anus and advanced to the the cecum, identified                            by appendiceal orifice and ileocecal valve. The                            colonoscopy was performed without difficulty. The                            patient tolerated the procedure well. The quality                            of the bowel preparation was adequate. Scope In: 7:52:10 AM Scope Out: 8:13:26 AM Scope Withdrawal Time: 0 hours 8 minutes 4 seconds  Total Procedure Duration: 0 hours 21 minutes 16 seconds  Findings:      The perianal and digital rectal examinations were normal.      Scattered small and large-mouthed diverticula were found in the sigmoid       colon and descending colon.      The exam was otherwise without abnormality on direct and retroflexion       views. Impression:               - Diverticulosis in the sigmoid colon and in the                            descending colon.                           -  The examination was otherwise normal on direct                            and retroflexion views.                           - No specimens collected. Moderate Sedation:      Moderate (conscious) sedation was administered by the endoscopy nurse       and supervised by the endoscopist. The following parameters were       monitored: oxygen saturation, heart rate, blood pressure, respiratory       rate, EKG, adequacy of pulmonary ventilation, and response to care.       Total physician intraservice time was 27 minutes. Recommendation:           - Patient has a contact number available for                            emergencies. The signs and symptoms of potential                            delayed complications were discussed  with the                            patient. Return to normal activities tomorrow.                            Written discharge instructions were provided to the                            patient.                           - Advance diet as tolerated.                           - Repeat colonoscopy in 10 years for screening                            purposes.                           - Return to GI office PRN. Procedure Code(s):        --- Professional ---                           [phone], Colonoscopy, flexible; diagnostic, including                            collection of specimen(s) by brushing or washing,                            when performed (separate procedure)                           G0500, Moderate sedation services provided by the  same physician or other qualified health care                            professional performing a gastrointestinal                            endoscopic service that sedation supports,                            requiring the presence of an independent trained                            observer to assist in the monitoring of the                            patient's level of consciousness and physiological                            status; initial 15 minutes of intra-service time;                            patient age 38 years or older (additional time may                            be reported with [phone], as appropriate)                           [phone], Moderate sedation services provided by the                            same physician or other qualified health care                            professional performing the diagnostic or                            therapeutic service that the sedation supports,                            requiring the presence of an independent trained                            observer to assist in the monitoring of the                            patient's level of consciousness and  physiological                            status; each additional 15 minutes intraservice                            time (List separately in addition to code for  primary service) Diagnosis Code(s):        --- Professional ---                           Z86.010, Personal history of colonic polyps                           K57.30, Diverticulosis of large intestine without                            perforation or abscess without bleeding CPT copyright 2017 American Medical Association. All rights reserved. The codes documented in this report are preliminary and upon coder review may  be revised to meet current compliance requirements. Cristopher Estimable. Jensyn Shave, MD Norvel Richards, MD 02/09/2018 8:22:03 AM This report has been signed electronically. Number of Addenda: 0

## 2018-02-14 ENCOUNTER — Encounter (HOSPITAL_COMMUNITY): Payer: Self-pay | Admitting: Internal Medicine

## 2018-11-08 ENCOUNTER — Other Ambulatory Visit (HOSPITAL_COMMUNITY): Payer: Self-pay | Admitting: Family Medicine

## 2018-11-08 DIAGNOSIS — Z1231 Encounter for screening mammogram for malignant neoplasm of breast: Secondary | ICD-10-CM

## 2018-11-12 ENCOUNTER — Ambulatory Visit (HOSPITAL_COMMUNITY)
Admission: RE | Admit: 2018-11-12 | Discharge: 2018-11-12 | Disposition: A | Discharge status: Home / Self Care | Source: Ambulatory Visit | Attending: Family Medicine | Admitting: Family Medicine

## 2018-11-12 ENCOUNTER — Other Ambulatory Visit: Payer: Self-pay

## 2018-11-12 DIAGNOSIS — Z1231 Encounter for screening mammogram for malignant neoplasm of breast: Secondary | ICD-10-CM | POA: Insufficient documentation

## 2019-08-09 ENCOUNTER — Ambulatory Visit: Payer: Self-pay | Attending: Internal Medicine

## 2019-08-09 DIAGNOSIS — Z23 Encounter for immunization: Secondary | ICD-10-CM

## 2019-08-09 NOTE — Progress Notes (Signed)
   Covid-19 Vaccination Clinic  Name:  Sonia Lyons    MRN: [id number] DOB: 1955-10-15  08/09/2019  [name] was observed post Covid-19 immunization for 15 minutes without incident. She was provided with Vaccine Information Sheet and instruction to access the V-Safe system.   [name] was instructed to call 911 with any severe reactions post vaccine: Marland Kitchen Difficulty breathing  . Swelling of face and throat  . A fast heartbeat  . A bad rash all over body  . Dizziness and weakness   Immunizations Administered    Name Date Dose VIS Date Route   Moderna COVID-19 Vaccine 08/09/2019 10:04 AM 0.5 mL 04/23/2019 Intramuscular   Manufacturer: Moderna   Lot: [id number]   [id number]

## 2019-09-11 ENCOUNTER — Ambulatory Visit: Payer: Self-pay | Attending: Internal Medicine

## 2019-09-11 DIAGNOSIS — Z23 Encounter for immunization: Secondary | ICD-10-CM

## 2019-09-11 NOTE — Progress Notes (Signed)
   Covid-19 Vaccination Clinic  Name:  Sonia Lyons    MRN: [id number] DOB: July 15, 1955  09/11/2019  [name] was observed post Covid-19 immunization for 15 minutes without incident. She was provided with Vaccine Information Sheet and instruction to access the V-Safe system.   [name] was instructed to call 911 with any severe reactions post vaccine: Marland Kitchen Difficulty breathing  . Swelling of face and throat  . A fast heartbeat  . A bad rash all over body  . Dizziness and weakness   Immunizations Administered    Name Date Dose VIS Date Route   Moderna COVID-19 Vaccine 09/11/2019  1:43 PM 0.5 mL 04/2019 Intramuscular   Manufacturer: Moderna   Lot: [id number]   VaditoBE:[id number]

## 2019-12-20 ENCOUNTER — Other Ambulatory Visit (HOSPITAL_COMMUNITY): Payer: Self-pay | Admitting: Family Medicine

## 2019-12-20 DIAGNOSIS — Z1231 Encounter for screening mammogram for malignant neoplasm of breast: Secondary | ICD-10-CM

## 2019-12-30 ENCOUNTER — Other Ambulatory Visit: Payer: Self-pay

## 2019-12-30 ENCOUNTER — Ambulatory Visit (HOSPITAL_COMMUNITY)
Admission: RE | Admit: 2019-12-30 | Discharge: 2019-12-30 | Disposition: A | Discharge status: Home / Self Care | Source: Ambulatory Visit | Attending: Family Medicine | Admitting: Family Medicine

## 2019-12-30 DIAGNOSIS — Z1231 Encounter for screening mammogram for malignant neoplasm of breast: Secondary | ICD-10-CM | POA: Diagnosis not present

## 2020-06-02 ENCOUNTER — Other Ambulatory Visit

## 2020-06-09 ENCOUNTER — Other Ambulatory Visit

## 2020-06-11 ENCOUNTER — Other Ambulatory Visit: Payer: Self-pay

## 2020-06-11 DIAGNOSIS — Z136 Encounter for screening for cardiovascular disorders: Secondary | ICD-10-CM

## 2020-06-11 DIAGNOSIS — Z13220 Encounter for screening for lipoid disorders: Secondary | ICD-10-CM

## 2020-06-12 ENCOUNTER — Ambulatory Visit (INDEPENDENT_AMBULATORY_CARE_PROVIDER_SITE_OTHER): Admitting: Family Medicine

## 2020-06-12 ENCOUNTER — Encounter: Payer: Self-pay | Admitting: Family Medicine

## 2020-06-12 VITALS — BP 130/68 | HR 82 | Temp 98.2°F | Resp 14 | Ht 72.0 in | Wt 160.0 lb

## 2020-06-12 DIAGNOSIS — Z0000 Encounter for general adult medical examination without abnormal findings: Secondary | ICD-10-CM

## 2020-06-12 LAB — LIPID PANEL
Cholesterol: 265 mg/dL — ABNORMAL HIGH (ref <200)
HDL: 68 mg/dL (ref >=50)
LDL Cholesterol (Calc): 173 mg/dL (calc) — ABNORMAL HIGH
Non-HDL Cholesterol (Calc): 197 mg/dL (calc) — ABNORMAL HIGH (ref <130)
Total CHOL/HDL Ratio: 3.9 (calc) (ref <5.0)
Triglycerides: 113 mg/dL (ref <150)

## 2020-06-12 LAB — COMPLETE METABOLIC PANEL WITH GFR
AG Ratio: 1.7 (calc) (ref 1.0–2.5)
ALT: 11 U/L (ref 6–29)
AST: 17 U/L (ref 10–35)
Albumin: 4.3 g/dL (ref 3.6–5.1)
Alkaline phosphatase (APISO): 61 U/L (ref 37–153)
BUN: 13 mg/dL (ref 7–25)
CO2: 31 mmol/L (ref 20–32)
Calcium: 9.8 mg/dL (ref 8.6–10.4)
Chloride: 103 mmol/L (ref 98–110)
Creat: 0.88 mg/dL (ref 0.50–0.99)
GFR, Est African American: 82 mL/min/{1.73_m2} (ref >=60)
GFR, Est Non African American: 70 mL/min/{1.73_m2} (ref >=60)
Globulin: 2.5 g/dL (calc) (ref 1.9–3.7)
Glucose, Bld: 85 mg/dL (ref 65–99)
Potassium: 4.4 mmol/L (ref 3.5–5.3)
Sodium: 140 mmol/L (ref 135–146)
Total Bilirubin: 0.7 mg/dL (ref 0.2–1.2)
Total Protein: 6.8 g/dL (ref 6.1–8.1)

## 2020-06-12 LAB — CBC WITH DIFFERENTIAL/PLATELET
Absolute Monocytes: 303 cells/uL (ref 200–950)
Basophils Absolute: 29 cells/uL (ref 0–200)
Basophils Relative: 0.7 %
Eosinophils Absolute: 41 cells/uL (ref 15–500)
Eosinophils Relative: 1 %
HCT: 39 % (ref 35.0–45.0)
Hemoglobin: 12.9 g/dL (ref 11.7–15.5)
Lymphs Abs: 1488 cells/uL (ref 850–3900)
MCH: 30.9 pg (ref 27.0–33.0)
MCHC: 33.1 g/dL (ref 32.0–36.0)
MCV: 93.5 fL (ref 80.0–100.0)
MPV: 10.2 fL (ref 7.5–12.5)
Monocytes Relative: 7.4 %
Neutro Abs: 2239 cells/uL (ref 1500–7800)
Neutrophils Relative %: 54.6 %
Platelets: 251 10*3/uL (ref 140–400)
RBC: 4.17 10*6/uL (ref 3.80–5.10)
RDW: 11.9 % (ref 11.0–15.0)
Total Lymphocyte: 36.3 %
WBC: 4.1 10*3/uL (ref 3.8–10.8)

## 2020-06-12 NOTE — Progress Notes (Signed)
Subjective:    Patient ID: Sonia Lyons, female    DOB: [birth year], 65 y.o.   MRN: [id number]  HPI Patient is a very pleasant 65 year old Caucasian female here today for complete physical exam.  Mammogram was performed in August of last year and was normal.  Colonoscopy is up-to-date and was performed in 2019.  Patient sees a gynecologist however she has a history of a hysterectomy and therefore does not require Pap smears.  There is no premature history of osteoporosis in her family and therefore she is not due for bone density test until 67.  Her immunizations are up-to-date as listed below. Immunization History  Administered Date(s) Administered  . DTaP 09/26/2011  . Influenza,inj,Quad PF,6+ Mos 04/17/2020  . Moderna Sars-Covid-2 Vaccination 08/09/2019, 09/11/2019, 04/17/2020  . Td 09/26/2011  . Zoster Recombinat (Shingrix) 12/17/2017, 06/03/2018    Lab on 06/11/2020  Component Date Value Ref Range Status  . WBC 06/11/2020 4.1  3.8 - 10.8 Thousand/uL Final  . RBC 06/11/2020 4.17  3.80 - 5.10 Million/uL Final  . Hemoglobin 06/11/2020 12.9  11.7 - 15.5 g/dL Final  . HCT 06/11/2020 39.0  35.0 - 45.0 % Final  . MCV 06/11/2020 93.5  80.0 - 100.0 fL Final  . MCH 06/11/2020 30.9  27.0 - 33.0 pg Final  . MCHC 06/11/2020 33.1  32.0 - 36.0 g/dL Final  . RDW 06/11/2020 11.9  11.0 - 15.0 % Final  . Platelets 06/11/2020 251  140 - 400 Thousand/uL Final  . MPV 06/11/2020 10.2  7.5 - 12.5 fL Final  . Neutro Abs 06/11/2020 2,239  1,500 - 7,800 cells/uL Final  . Lymphs Abs 06/11/2020 1,488  850 - 3,900 cells/uL Final  . Absolute Monocytes 06/11/2020 303  200 - 950 cells/uL Final  . Eosinophils Absolute 06/11/2020 41  15 - 500 cells/uL Final  . Basophils Absolute 06/11/2020 29  0 - 200 cells/uL Final  . Neutrophils Relative % 06/11/2020 54.6  % Final  . Total Lymphocyte 06/11/2020 36.3  % Final  . Monocytes Relative 06/11/2020 7.4  % Final  . Eosinophils Relative 06/11/2020 1.0  % Final  .  Basophils Relative 06/11/2020 0.7  % Final  . Glucose, Bld 06/11/2020 85  65 - 99 mg/dL Final   Comment: .            Fasting reference interval .   . BUN 06/11/2020 13  7 - 25 mg/dL Final  . Creat 06/11/2020 0.88  0.50 - 0.99 mg/dL Final   Comment: For patients >65 years of age, the reference limit for Creatinine is approximately 13% higher for people identified as African-American. .   . GFR, Est Non African American 06/11/2020 70  > OR = 60 mL/min/1.52m2 Final  . GFR, Est African American 06/11/2020 82  > OR = 60 mL/min/1.81m2 Final  . BUN/Creatinine Ratio 123456 NOT APPLICABLE  6 - 22 (calc) Final  . Sodium 06/11/2020 140  135 - 146 mmol/L Final  . Potassium 06/11/2020 4.4  3.5 - 5.3 mmol/L Final  . Chloride 06/11/2020 103  98 - 110 mmol/L Final  . CO2 06/11/2020 31  20 - 32 mmol/L Final  . Calcium 06/11/2020 9.8  8.6 - 10.4 mg/dL Final  . Total Protein 06/11/2020 6.8  6.1 - 8.1 g/dL Final  . Albumin 06/11/2020 4.3  3.6 - 5.1 g/dL Final  . Globulin 06/11/2020 2.5  1.9 - 3.7 g/dL (calc) Final  . AG Ratio 06/11/2020 1.7  1.0 - 2.5 (calc) Final  .  Total Bilirubin 06/11/2020 0.7  0.2 - 1.2 mg/dL Final  . Alkaline phosphatase (APISO) 06/11/2020 61  37 - 153 U/L Final  . AST 06/11/2020 17  10 - 35 U/L Final  . ALT 06/11/2020 11  6 - 29 U/L Final  . Cholesterol 06/11/2020 265* <200 mg/dL Final  . HDL 06/11/2020 68  > OR = 50 mg/dL Final  . Triglycerides 06/11/2020 113  <150 mg/dL Final  . LDL Cholesterol (Calc) 06/11/2020 173* mg/dL (calc) Final   Comment: Reference range: <100 . Desirable range <100 mg/dL for primary prevention;   <70 mg/dL for patients with CHD or diabetic patients  with > or = 2 CHD risk factors. Marland Kitchen LDL-C is now calculated using the Martin-Hopkins  calculation, which is a validated novel method providing  better accuracy than the Friedewald equation in the  estimation of LDL-C.  Cresenciano Genre et al. Annamaria Helling. [id number]): 2061-2068   (http://education.[facility].com/faq/FAQ164)   . Total CHOL/HDL Ratio 06/11/2020 3.9  <5.0 (calc) Final  . Non-HDL Cholesterol (Calc) 06/11/2020 197* <130 mg/dL (calc) Final   Comment: For patients with diabetes plus 1 major ASCVD risk  factor, treating to a non-HDL-C goal of <100 mg/dL  (LDL-C of <70 mg/dL) is considered a therapeutic  option.    Past Medical History:  Diagnosis Date  . Colon polyp   . Hyperlipidemia   . Migraine    Migraines  . SVT (supraventricular tachycardia) (HCC)    Past Surgical History:  Procedure Laterality Date  . ABDOMINAL HYSTERECTOMY     fibroids  . Bone marrow removed from both hips    . COLONOSCOPY    . COLONOSCOPY N/A 02/09/2018   Procedure: COLONOSCOPY;  Surgeon: Daneil Dolin, MD;  Location[facility];  Service: Endoscopy;  Laterality: N/A;  9:45  . FRACTURE SURGERY N/A    Phreesia 06/09/2020  . Left wrist surgery    . Right elbow surgery  12/26/2017  . SINUS SURGERY WITH INSTATRAK     Current Outpatient Medications on File Prior to Visit  Medication Sig Dispense Refill  . MULTIPLE VITAMIN PO Take 1 tablet by mouth daily.     . Pumpkin Seed-Soy Germ (AZO BLADDER CONTROL/GO-LESS PO) Take by mouth.    . Rhubarb (ESTROVEN COMPLETE) 4 MG TABS Take by mouth.     No current facility-administered medications on file prior to visit.   Allergies  Allergen Reactions  . Codeine Nausea Only and Other (See Comments)   Social History   Socioeconomic History  . Marital status: Married    Spouse name: Not on file  . Number of children: Not on file  . Years of education: Not on file  . Highest education level: Not on file  Occupational History  . Not on file  Tobacco Use  . Smoking status: Former Research scientist (life sciences)  . Smokeless tobacco: Never Used  Vaping Use  . Vaping Use: Never used  Substance and Sexual Activity  . Alcohol use: Yes    Comment: Rare  . Drug use: No  . Sexual activity: Yes    Comment: married to [place]  Other Topics  Concern  . Not on file  Social History Narrative  . Not on file   Social Determinants of Health   Financial Resource Strain: Not on file  Food Insecurity: Not on file  Transportation Needs: Not on file  Physical Activity: Not on file  Stress: Not on file  Social Connections: Not on file  Intimate Partner Violence: Not on file  Family History  Problem Relation Age of Onset  . Hyperlipidemia Father   . Diabetes Sister   . Cancer Sister 20       breast   . HIV Brother    Mom died suddenly at 59 either to myocardial infarction or possibly pulmonary embolism   Review of Systems  All other systems reviewed and are negative.      Objective:   Physical Exam Vitals reviewed.  Constitutional:      General: She is not in acute distress.    Appearance: She is well-developed. She is not diaphoretic.  HENT:     Head: Normocephalic and atraumatic.     Right Ear: External ear normal.     Left Ear: External ear normal.     Nose: Nose normal.     Mouth/Throat:     Pharynx: No oropharyngeal exudate.  Eyes:     General: No scleral icterus.       Right eye: No discharge.        Left eye: No discharge.     Conjunctiva/sclera: Conjunctivae normal.     Pupils: Pupils are equal, round, and reactive to light.  Neck:     Thyroid: No thyromegaly.     Vascular: No JVD.     Trachea: No tracheal deviation.  Cardiovascular:     Rate and Rhythm: Normal rate and regular rhythm.     Heart sounds: Normal heart sounds. No murmur heard. No friction rub. No gallop.   Pulmonary:     Effort: Pulmonary effort is normal. No respiratory distress.     Breath sounds: Normal breath sounds. No stridor. No wheezing or rales.  Chest:     Chest wall: No tenderness.  Abdominal:     General: Bowel sounds are normal. There is no distension.     Palpations: Abdomen is soft. There is no mass.     Tenderness: There is no abdominal tenderness. There is no guarding or rebound.  Musculoskeletal:         General: No tenderness or deformity. Normal range of motion.     Cervical back: Normal range of motion and neck supple.  Lymphadenopathy:     Cervical: No cervical adenopathy.  Skin:    General: Skin is warm.     Coloration: Skin is not pale.     Findings: No erythema or rash.  Neurological:     Mental Status: She is alert and oriented to person, place, and time.     Cranial Nerves: No cranial nerve deficit.     Motor: No abnormal muscle tone.     Coordination: Coordination normal.     Deep Tendon Reflexes: Reflexes are normal and symmetric.  Psychiatric:        Behavior: Behavior normal.        Thought Content: Thought content normal.        Judgment: Judgment normal.           Assessment & Plan:  General medical exam  Physical exam today is excellent outside of her cholesterol.  I calculated her 10-year risk to be 4.5%.  We discussed whether she wanted to start taking a statin.  She has no family history of cardiovascular disease.  We discussed dietary changes, empiric use of a statin, or obtaining a coronary artery calcium score to risk stratify her more accurately.  She would like to try dietary changes and then recheck cholesterol in 6 months.  Immunizations are up-to-date.  Cancer screening is up-to-date.  Regular anticipatory guidance is provided.  Did recommend 1200 mg a day of calcium and 1000 units a day of vitamin D

## 2020-12-10 ENCOUNTER — Other Ambulatory Visit: Payer: Self-pay

## 2020-12-10 ENCOUNTER — Other Ambulatory Visit

## 2020-12-10 DIAGNOSIS — Z13228 Encounter for screening for other metabolic disorders: Secondary | ICD-10-CM

## 2020-12-10 DIAGNOSIS — Z13220 Encounter for screening for lipoid disorders: Secondary | ICD-10-CM

## 2020-12-10 LAB — LIPID PANEL
Cholesterol: 268 mg/dL — ABNORMAL HIGH (ref <200)
HDL: 77 mg/dL (ref >=50)
LDL Cholesterol (Calc): 170 mg/dL (calc) — ABNORMAL HIGH
Non-HDL Cholesterol (Calc): 191 mg/dL (calc) — ABNORMAL HIGH (ref <130)
Total CHOL/HDL Ratio: 3.5 (calc) (ref <5.0)
Triglycerides: 96 mg/dL (ref <150)

## 2020-12-10 LAB — COMPLETE METABOLIC PANEL WITH GFR
AG Ratio: 1.8 (calc) (ref 1.0–2.5)
ALT: 10 U/L (ref 6–29)
AST: 18 U/L (ref 10–35)
Albumin: 4.6 g/dL (ref 3.6–5.1)
Alkaline phosphatase (APISO): 62 U/L (ref 37–153)
BUN: 16 mg/dL (ref 7–25)
CO2: 29 mmol/L (ref 20–32)
Calcium: 9.7 mg/dL (ref 8.6–10.4)
Chloride: 103 mmol/L (ref 98–110)
Creat: 0.86 mg/dL (ref 0.50–1.05)
Globulin: 2.6 g/dL (calc) (ref 1.9–3.7)
Glucose, Bld: 82 mg/dL (ref 65–99)
Potassium: 4.2 mmol/L (ref 3.5–5.3)
Sodium: 139 mmol/L (ref 135–146)
Total Bilirubin: 0.7 mg/dL (ref 0.2–1.2)
Total Protein: 7.2 g/dL (ref 6.1–8.1)
eGFR: 76 mL/min/{1.73_m2} (ref >=60)

## 2020-12-10 LAB — CBC WITH DIFFERENTIAL/PLATELET
Absolute Monocytes: 320 cells/uL (ref 200–950)
Basophils Absolute: 20 cells/uL (ref 0–200)
Basophils Relative: 0.5 %
Eosinophils Absolute: 52 cells/uL (ref 15–500)
Eosinophils Relative: 1.3 %
HCT: 40.8 % (ref 35.0–45.0)
Hemoglobin: 13.2 g/dL (ref 11.7–15.5)
Lymphs Abs: 984 cells/uL (ref 850–3900)
MCH: 30.1 pg (ref 27.0–33.0)
MCHC: 32.4 g/dL (ref 32.0–36.0)
MCV: 92.9 fL (ref 80.0–100.0)
MPV: 10.4 fL (ref 7.5–12.5)
Monocytes Relative: 8 %
Neutro Abs: 2624 cells/uL (ref 1500–7800)
Neutrophils Relative %: 65.6 %
Platelets: 252 10*3/uL (ref 140–400)
RBC: 4.39 10*6/uL (ref 3.80–5.10)
RDW: 12.1 % (ref 11.0–15.0)
Total Lymphocyte: 24.6 %
WBC: 4 10*3/uL (ref 3.8–10.8)

## 2020-12-11 ENCOUNTER — Encounter: Payer: Self-pay | Admitting: Family Medicine

## 2020-12-11 ENCOUNTER — Ambulatory Visit (INDEPENDENT_AMBULATORY_CARE_PROVIDER_SITE_OTHER): Admitting: Family Medicine

## 2020-12-11 VITALS — BP 152/70 | HR 85 | Temp 98.1°F | Wt 160.0 lb

## 2020-12-11 DIAGNOSIS — E7800 Pure hypercholesterolemia, unspecified: Secondary | ICD-10-CM | POA: Diagnosis not present

## 2020-12-11 MED ORDER — ROSUVASTATIN CALCIUM 5 MG PO TABS: 5.0000 mg | ORAL_TABLET | Freq: Every day | ORAL | 3 refills | Status: DC

## 2020-12-11 NOTE — Progress Notes (Signed)
Subjective:    Patient ID: Sonia Lyons, female    DOB: 21-Dec-1955, 65 y.o.   MRN: [id number]  HPI At the patient's last visit, her cholesterol was found to be elevated.  She is here today to recheck that.  She has been trying aggressive lifestyle changes over the last 6 months but unfortunately her cholesterol essentially remains unchanged.  She states that her sister is also similarly very athletic and eats healthy and also has high cholesterol however they performed a coronary artery calcium score and hers was very reassuring and she elected not to take cholesterol medications based on that.  The patient denies any family history of premature cardiovascular disease.  Heart disease does not run in her family which makes her hesitant to want to take the medications unnecessarily Lab on 12/10/2020  Component Date Value Ref Range Status   Glucose, Bld 12/10/2020 82  65 - 99 mg/dL Final   Comment: .            Fasting reference interval .    BUN 12/10/2020 16  7 - 25 mg/dL Final   Creat 12/10/2020 0.86  0.50 - 1.05 mg/dL Final   eGFR 12/10/2020 76  > OR = 60 mL/min/1.31m Final   Comment: The eGFR is based on the CKD-EPI 2021 equation. To calculate  the new eGFR from a previous Creatinine or Cystatin C result, go to https://www.kidney.org/professionals/ kdoqi/gfr%5Fcalculator    BUN/Creatinine Ratio 064/40/3474NOT APPLICABLE  6 - 22 (calc) Final   Sodium 12/10/2020 139  135 - 146 mmol/L Final   Potassium 12/10/2020 4.2  3.5 - 5.3 mmol/L Final   Chloride 12/10/2020 103  98 - 110 mmol/L Final   CO2 12/10/2020 29  20 - 32 mmol/L Final   Calcium 12/10/2020 9.7  8.6 - 10.4 mg/dL Final   Total Protein 12/10/2020 7.2  6.1 - 8.1 g/dL Final   Albumin 12/10/2020 4.6  3.6 - 5.1 g/dL Final   Globulin 12/10/2020 2.6  1.9 - 3.7 g/dL (calc) Final   AG Ratio 12/10/2020 1.8  1.0 - 2.5 (calc) Final   Total Bilirubin 12/10/2020 0.7  0.2 - 1.2 mg/dL Final   Alkaline phosphatase (APISO) 12/10/2020 62   37 - 153 U/L Final   AST 12/10/2020 18  10 - 35 U/L Final   ALT 12/10/2020 10  6 - 29 U/L Final   Cholesterol 12/10/2020 268 (A) <200 mg/dL Final   HDL 12/10/2020 77  > OR = 50 mg/dL Final   Triglycerides 12/10/2020 96  <150 mg/dL Final   LDL Cholesterol (Calc) 12/10/2020 170 (A) mg/dL (calc) Final   Comment: Reference range: <100 . Desirable range <100 mg/dL for primary prevention;   <70 mg/dL for patients with CHD or diabetic patients  with > or = 2 CHD risk factors. .Marland KitchenLDL-C is now calculated using the Martin-Hopkins  calculation, which is a validated novel method providing  better accuracy than the Friedewald equation in the  estimation of LDL-C.  MCresenciano Genreet al. JAnnamaria Helling 22595;638(75: 2061-2068  (http://education.[facility].com/faq/FAQ164)    Total CHOL/HDL Ratio 12/10/2020 3.5  <5.0 (calc) Final   Non-HDL Cholesterol (Calc) 12/10/2020 191 (A) <130 mg/dL (calc) Final   Comment: For patients with diabetes plus 1 major ASCVD risk  factor, treating to a non-HDL-C goal of <100 mg/dL  (LDL-C of <70 mg/dL) is considered a therapeutic  option.    WBC 12/10/2020 4.0  3.8 - 10.8 Thousand/uL Final   RBC 12/10/2020 4.39  3.80 -  5.10 Million/uL Final   Hemoglobin 12/10/2020 13.2  11.7 - 15.5 g/dL Final   HCT 12/10/2020 40.8  35.0 - 45.0 % Final   MCV 12/10/2020 92.9  80.0 - 100.0 fL Final   MCH 12/10/2020 30.1  27.0 - 33.0 pg Final   MCHC 12/10/2020 32.4  32.0 - 36.0 g/dL Final   RDW 12/10/2020 12.1  11.0 - 15.0 % Final   Platelets 12/10/2020 252  140 - 400 Thousand/uL Final   MPV 12/10/2020 10.4  7.5 - 12.5 fL Final   Neutro Abs 12/10/2020 2,624  1,500 - 7,800 cells/uL Final   Lymphs Abs 12/10/2020 984  850 - 3,900 cells/uL Final   Absolute Monocytes 12/10/2020 320  200 - 950 cells/uL Final   Eosinophils Absolute 12/10/2020 52  15 - 500 cells/uL Final   Basophils Absolute 12/10/2020 20  0 - 200 cells/uL Final   Neutrophils Relative % 12/10/2020 65.6  % Final   Total Lymphocyte  12/10/2020 24.6  % Final   Monocytes Relative 12/10/2020 8.0  % Final   Eosinophils Relative 12/10/2020 1.3  % Final   Basophils Relative 12/10/2020 0.5  % Final    Past Medical History:  Diagnosis Date   Colon polyp    Hyperlipidemia    Migraine    Migraines   SVT (supraventricular tachycardia) (HCC)    Current Outpatient Medications on File Prior to Visit  Medication Sig Dispense Refill   MULTIPLE VITAMIN PO Take 1 tablet by mouth daily.      Pumpkin Seed-Soy Germ (AZO BLADDER CONTROL/GO-LESS PO) Take by mouth.     Rhubarb (ESTROVEN COMPLETE) 4 MG TABS Take by mouth.     No current facility-administered medications on file prior to visit.   Allergies  Allergen Reactions   Codeine Nausea Only and Other (See Comments)   Social History   Socioeconomic History   Marital status: Married    Spouse name: Not on file   Number of children: Not on file   Years of education: Not on file   Highest education level: Not on file  Occupational History   Not on file  Tobacco Use   Smoking status: Former   Smokeless tobacco: Never  Vaping Use   Vaping Use: Never used  Substance and Sexual Activity   Alcohol use: Yes    Comment: Rare   Drug use: No   Sexual activity: Yes    Comment: married to [place]  Other Topics Concern   Not on file  Social History Narrative   Not on file   Social Determinants of Health   Financial Resource Strain: Not on file  Food Insecurity: Not on file  Transportation Needs: Not on file  Physical Activity: Not on file  Stress: Not on file  Social Connections: Not on file  Intimate Partner Violence: Not on file      Review of Systems  All other systems reviewed and are negative.     Objective:   Physical Exam Vitals reviewed.  Cardiovascular:     Rate and Rhythm: Normal rate and regular rhythm.     Heart sounds: Normal heart sounds.  Pulmonary:     Effort: Pulmonary effort is normal.     Breath sounds: Normal breath sounds.           Assessment & Plan:  Pure hypercholesterolemia - Plan: CT CARDIAC SCORING (SELF PAY ONLY) If possible, the patient would like to risk stratify with a coronary artery CT scan.  Based on her  family history and her sisters recent results, she may not require statin.  However based on her labs I would recommend Crestor at least 5 mg a day and then recheck lab work in 6 months.  Patient will start the medication but she would like to check and see how much the coronary artery CT scan will cost because it may help her make an informed decision about continuing cholesterol medication indefinitely

## 2020-12-31 ENCOUNTER — Ambulatory Visit (HOSPITAL_COMMUNITY)
Admission: RE | Admit: 2020-12-31 | Discharge: 2020-12-31 | Disposition: A | Discharge status: Home / Self Care | Source: Ambulatory Visit | Attending: Family Medicine | Admitting: Family Medicine

## 2020-12-31 ENCOUNTER — Other Ambulatory Visit: Payer: Self-pay

## 2020-12-31 DIAGNOSIS — E7800 Pure hypercholesterolemia, unspecified: Secondary | ICD-10-CM | POA: Insufficient documentation

## 2021-01-01 ENCOUNTER — Encounter: Payer: Self-pay | Admitting: Family Medicine

## 2021-03-12 ENCOUNTER — Other Ambulatory Visit (HOSPITAL_COMMUNITY): Payer: Self-pay | Admitting: Family Medicine

## 2021-03-12 DIAGNOSIS — Z1231 Encounter for screening mammogram for malignant neoplasm of breast: Secondary | ICD-10-CM

## 2021-03-26 ENCOUNTER — Other Ambulatory Visit: Payer: Self-pay

## 2021-03-26 ENCOUNTER — Ambulatory Visit (HOSPITAL_COMMUNITY)
Admission: RE | Admit: 2021-03-26 | Discharge: 2021-03-26 | Disposition: A | Discharge status: Home / Self Care | Source: Ambulatory Visit | Attending: Family Medicine | Admitting: Family Medicine

## 2021-03-26 DIAGNOSIS — Z1231 Encounter for screening mammogram for malignant neoplasm of breast: Secondary | ICD-10-CM | POA: Insufficient documentation

## 2021-08-26 ENCOUNTER — Ambulatory Visit (INDEPENDENT_AMBULATORY_CARE_PROVIDER_SITE_OTHER): Admitting: Family Medicine

## 2021-08-26 VITALS — BP 140/72 | HR 82 | Temp 97.6°F | Ht 73.0 in | Wt 165.6 lb

## 2021-08-26 DIAGNOSIS — R091 Pleurisy: Secondary | ICD-10-CM | POA: Diagnosis not present

## 2021-08-26 DIAGNOSIS — R079 Chest pain, unspecified: Secondary | ICD-10-CM

## 2021-08-26 NOTE — Progress Notes (Signed)
? ?Subjective:  ? ? Patient ID: Sonia Lyons, female    DOB: January 17, 1956, 66 y.o.   MRN: [id number] ? ?HPI ? ?Patient reports 4 months of chest pain.  The pain is located in her left axilla and left upper outer chest.  She reports pain and discomfort with deep inspiration.  She reports discomfort with coughing.  She also reports some mild dyspnea.  She has no pain with overhead activity.  She has no pain with internal or external rotation of the left shoulder.  She has no pain with resisted contraction of the pectoralis muscle.  She denies any angina.  She denies any shortness of breath.  She denies any hemoptysis.  She denies any fevers or chills.  She does have a history of smoking for more than 20 years however she quit more than 20 years ago.  She denies any weight loss.  She had a mammogram in November 2022 that was negative for any malignancy.  I had a chaperone present with me today on exam.  I palpated her upper outer quadrant of her left breast and did not appreciate any mass or nodularity.  There is no lymphadenopathy in the left axilla.  I am unable to reproduce the pain today on examination with palpation in the pectoralis muscle or with range of motion activity in the left shoulder.  However deep inspiration elicits pain. ?Past Medical History:  ?Diagnosis Date  ? Colon polyp   ? Hyperlipidemia   ? Migraine   ? Migraines  ? SVT (supraventricular tachycardia) ([place])   ? ?Past Surgical History:  ?Procedure Laterality Date  ? ABDOMINAL HYSTERECTOMY    ? fibroids  ? Bone marrow removed from both hips    ? COLONOSCOPY    ? COLONOSCOPY N/A 02/09/2018  ? Procedure: COLONOSCOPY;  Surgeon: Daneil Dolin, MD;  Location: [facility];  Service: Endoscopy;  Laterality: N/A;  9:45  ? FRACTURE SURGERY N/A   ? [facility] 06/09/2020  ? Left wrist surgery    ? Right elbow surgery  12/26/2017  ? SINUS SURGERY WITH INSTATRAK    ? ?Current Outpatient Medications on File Prior to Visit  ?Medication Sig Dispense Refill  ?  MULTIPLE VITAMIN PO Take 1 tablet by mouth daily.     ? Pumpkin Seed-Soy Germ (AZO BLADDER CONTROL/GO-LESS PO) Take by mouth. (Patient not taking: Reported on 08/26/2021)    ? Rhubarb (ESTROVEN COMPLETE) 4 MG TABS Take by mouth. (Patient not taking: Reported on 08/26/2021)    ? rosuvastatin (CRESTOR) 5 MG tablet Take 1 tablet (5 mg total) by mouth daily. (Patient not taking: Reported on 08/26/2021) 90 tablet 3  ? ?No current facility-administered medications on file prior to visit.  ? ?Allergies  ?Allergen Reactions  ? Codeine Nausea Only and Other (See Comments)  ? ?Social History  ? ?Socioeconomic History  ? Marital status: Married  ?  Spouse name: Not on file  ? Number of children: Not on file  ? Years of education: Not on file  ? Highest education level: Not on file  ?Occupational History  ? Not on file  ?Tobacco Use  ? Smoking status: Former  ? Smokeless tobacco: Never  ?Vaping Use  ? Vaping Use: Never used  ?Substance and Sexual Activity  ? Alcohol use: Yes  ?  Comment: Rare  ? Drug use: No  ? Sexual activity: Yes  ?  Comment: married to [place]  ?Other Topics Concern  ? Not on file  ?Social History  Narrative  ? Not on file  ? ?Social Determinants of Health  ? ?Financial Resource Strain: Not on file  ?Food Insecurity: Not on file  ?Transportation Needs: Not on file  ?Physical Activity: Not on file  ?Stress: Not on file  ?Social Connections: Not on file  ?Intimate Partner Violence: Not on file  ? ? ? ?Past Medical History:  ?Diagnosis Date  ? Colon polyp   ? Hyperlipidemia   ? Migraine   ? Migraines  ? SVT (supraventricular tachycardia) ([place])   ? ?Current Outpatient Medications on File Prior to Visit  ?Medication Sig Dispense Refill  ? MULTIPLE VITAMIN PO Take 1 tablet by mouth daily.     ? Pumpkin Seed-Soy Germ (AZO BLADDER CONTROL/GO-LESS PO) Take by mouth. (Patient not taking: Reported on 08/26/2021)    ? Rhubarb (ESTROVEN COMPLETE) 4 MG TABS Take by mouth. (Patient not taking: Reported on 08/26/2021)    ? rosuvastatin  (CRESTOR) 5 MG tablet Take 1 tablet (5 mg total) by mouth daily. (Patient not taking: Reported on 08/26/2021) 90 tablet 3  ? ?No current facility-administered medications on file prior to visit.  ? ?Allergies  ?Allergen Reactions  ? Codeine Nausea Only and Other (See Comments)  ? ?Social History  ? ?Socioeconomic History  ? Marital status: Married  ?  Spouse name: Not on file  ? Number of children: Not on file  ? Years of education: Not on file  ? Highest education level: Not on file  ?Occupational History  ? Not on file  ?Tobacco Use  ? Smoking status: Former  ? Smokeless tobacco: Never  ?Vaping Use  ? Vaping Use: Never used  ?Substance and Sexual Activity  ? Alcohol use: Yes  ?  Comment: Rare  ? Drug use: No  ? Sexual activity: Yes  ?  Comment: married to [place]  ?Other Topics Concern  ? Not on file  ?Social History Narrative  ? Not on file  ? ?Social Determinants of Health  ? ?Financial Resource Strain: Not on file  ?Food Insecurity: Not on file  ?Transportation Needs: Not on file  ?Physical Activity: Not on file  ?Stress: Not on file  ?Social Connections: Not on file  ?Intimate Partner Violence: Not on file  ? ? ? ? ?Review of Systems  ?All other systems reviewed and are negative. ? ?   ?Objective:  ? Physical Exam ?Vitals reviewed.  ?Constitutional:   ?   General: She is not in acute distress. ?   Appearance: Normal appearance. She is normal weight. She is not ill-appearing or toxic-appearing.  ?Cardiovascular:  ?   Rate and Rhythm: Normal rate and regular rhythm.  ?   Pulses: Normal pulses.  ?   Heart sounds: Normal heart sounds. No murmur heard. ?  No gallop.  ?Pulmonary:  ?   Effort: Pulmonary effort is normal. No respiratory distress.  ?   Breath sounds: Normal breath sounds. No stridor. No wheezing, rhonchi or rales.  ?Chest:  ?   Chest wall: No mass, tenderness or edema. There is no dullness to percussion.  ? ? ?Abdominal:  ?   General: Abdomen is flat. Bowel sounds are normal.  ?   Palpations: Abdomen is  soft.  ?Musculoskeletal:  ?   Right lower leg: No edema.  ?   Left lower leg: No edema.  ?Neurological:  ?   Mental Status: She is alert.  ? ? ? ? ? ?   ?Assessment & Plan:  ?Chest pain, unspecified type ? ?  Pleurisy ?Patient reports pleurisy and says pain in the upper left outer chest and left upper outer quadrant of her breast area.  There is no palpable abnormality today on exam.  There is no physical exam findings that are abnormal.  This could be chest wall pain.  Mammogram was normal in November.  Begin with a CT scan of the chest.  Given her smoking history and the pleurisy, I want to ensure that there is not any malignancy growing on the chest wall ?

## 2021-09-23 ENCOUNTER — Ambulatory Visit
Admission: RE | Admit: 2021-09-23 | Discharge: 2021-09-23 | Disposition: A | Discharge status: Home / Self Care | Source: Ambulatory Visit | Attending: Family Medicine | Admitting: Family Medicine

## 2021-09-23 DIAGNOSIS — R079 Chest pain, unspecified: Secondary | ICD-10-CM

## 2021-09-23 DIAGNOSIS — R091 Pleurisy: Secondary | ICD-10-CM

## 2021-09-23 MED ORDER — IOPAMIDOL (ISOVUE-300) INJECTION 61%
75.0000 mL | Freq: Once | INTRAVENOUS | Status: AC | PRN
  Administered 2021-09-23: 75 mL via INTRAVENOUS

## 2022-01-04 IMAGING — MG MM DIGITAL SCREENING BILAT W/ TOMO AND CAD
8 series · 9 of 24 positions shown · non-contrast
Comparison: Previous exam(s).

CLINICAL DATA: Screening.

EXAM:
DIGITAL SCREENING BILATERAL MAMMOGRAM WITH TOMOSYNTHESIS AND CAD
TECHNIQUE: Bilateral screening digital craniocaudal and mediolateral oblique
mammograms were obtained. Bilateral screening digital breast
tomosynthesis was performed. The images were evaluated with
computer-aided detection.

[R MLO synth-2D]
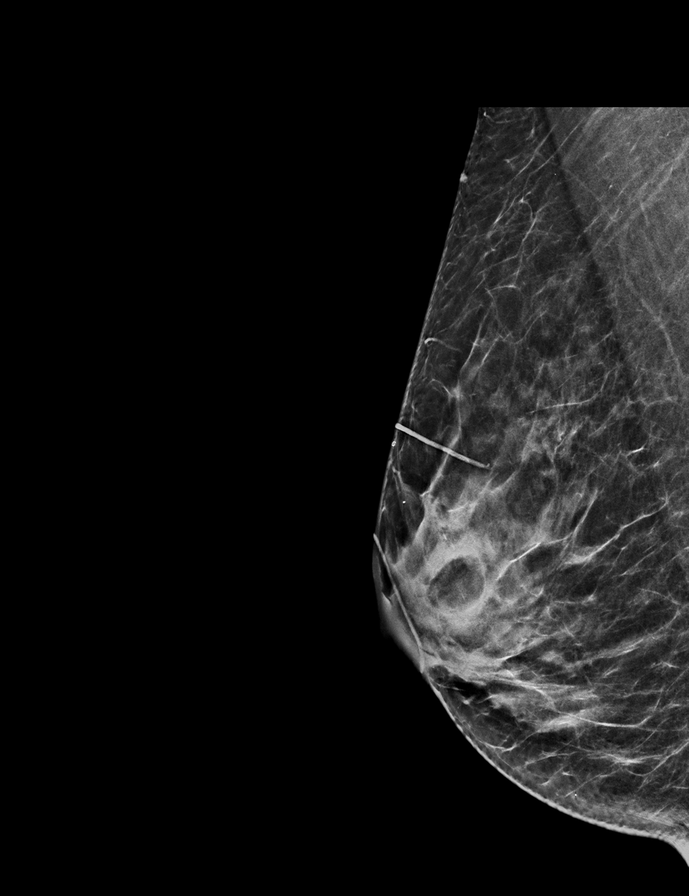

[L CC synth-2D]
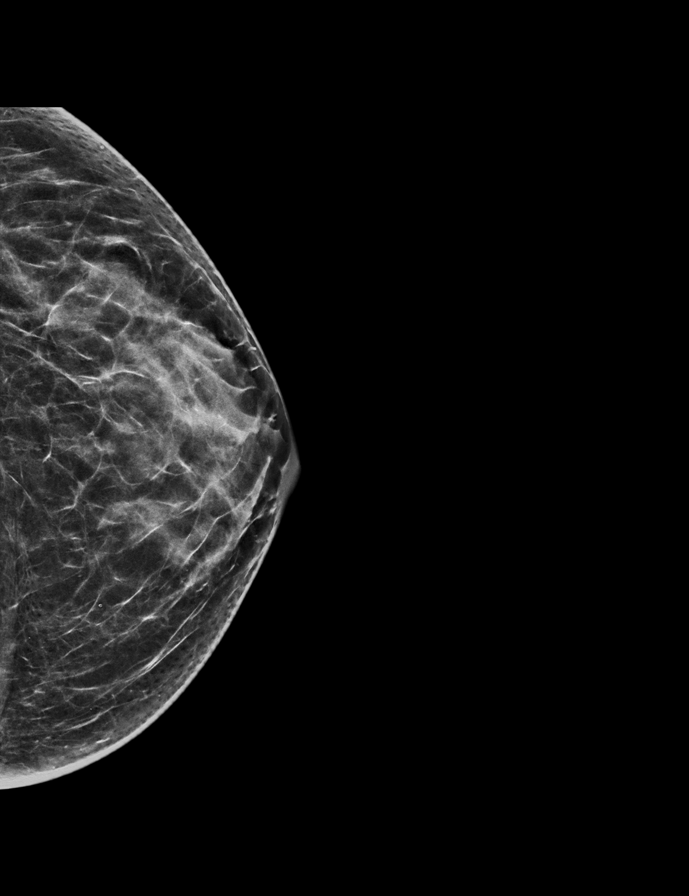

[R CC synth-2D]
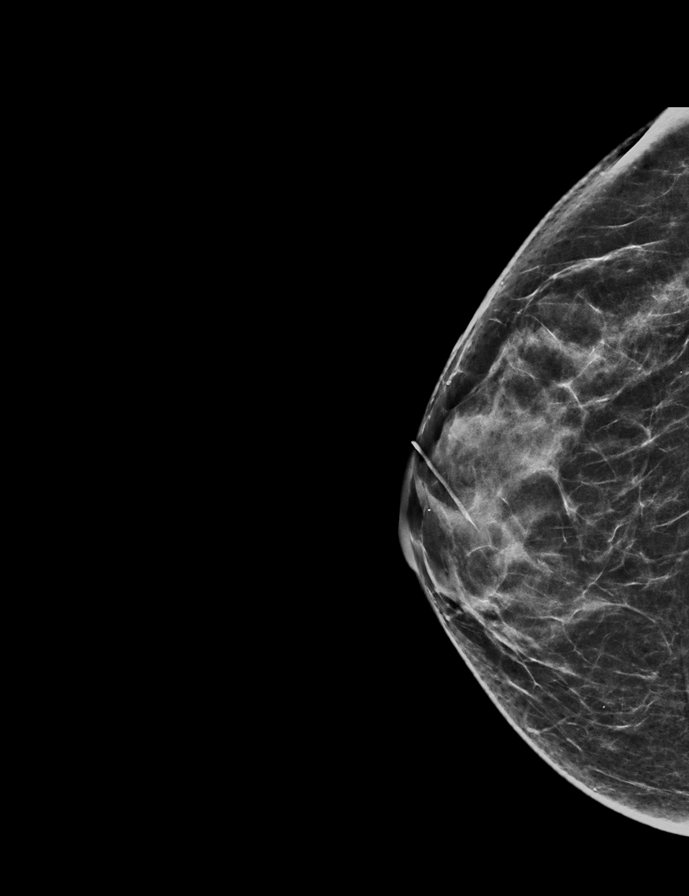

[L MLO synth-2D]
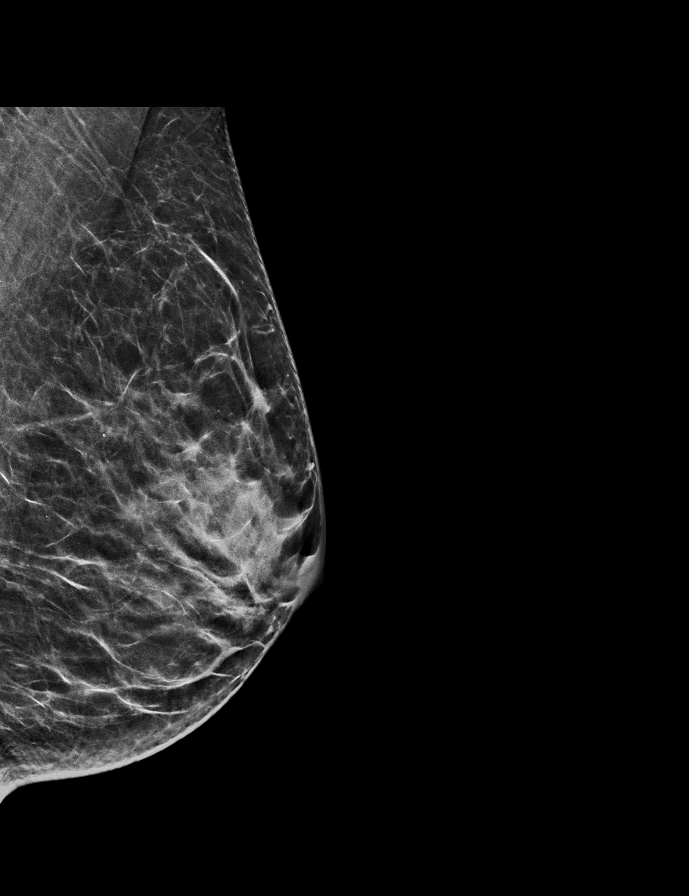

[L CC tomo · 2 of 57 frames shown]
[frame 19/57]
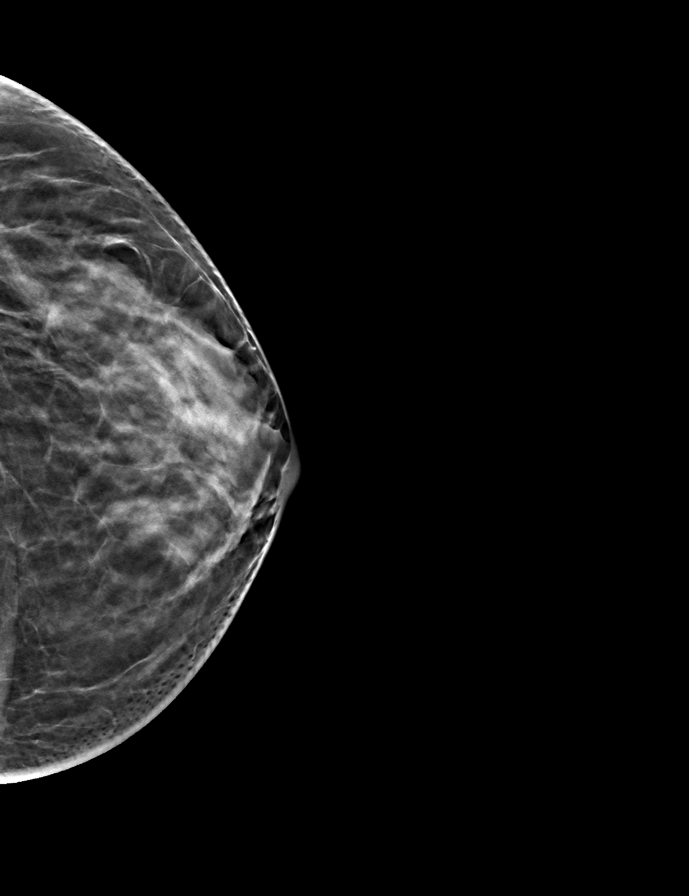
[frame 29/57]
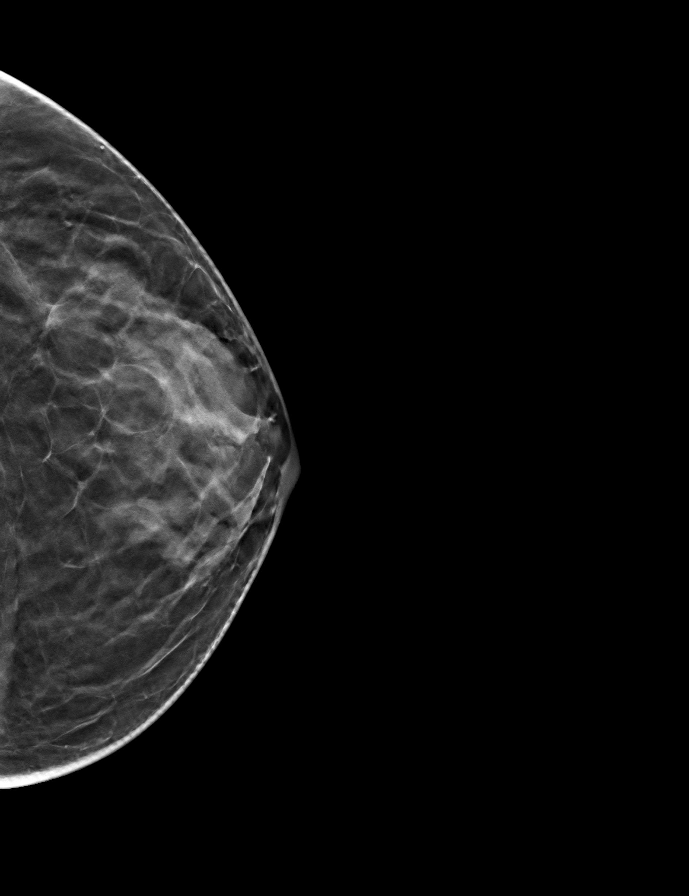

[R MLO tomo · tomo slice 29/56.0]
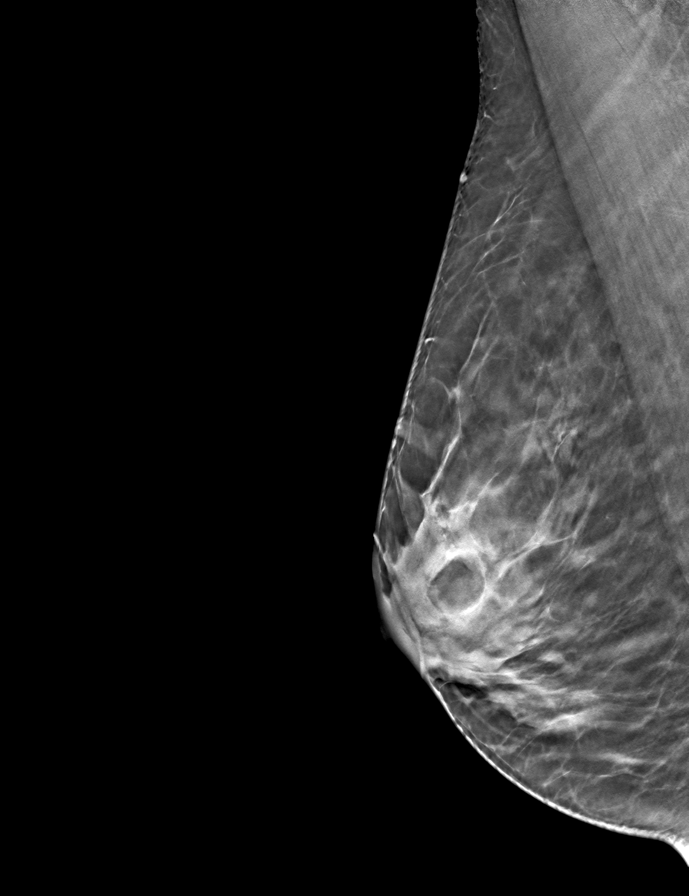

[R CC tomo · tomo slice 31/60.0]
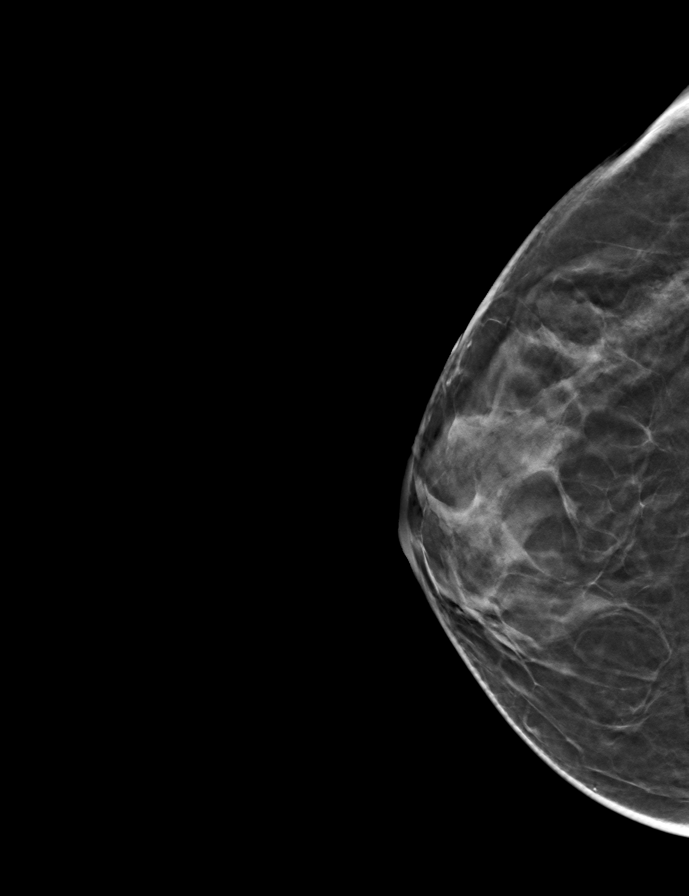

[L MLO tomo · tomo slice 27/53.0]
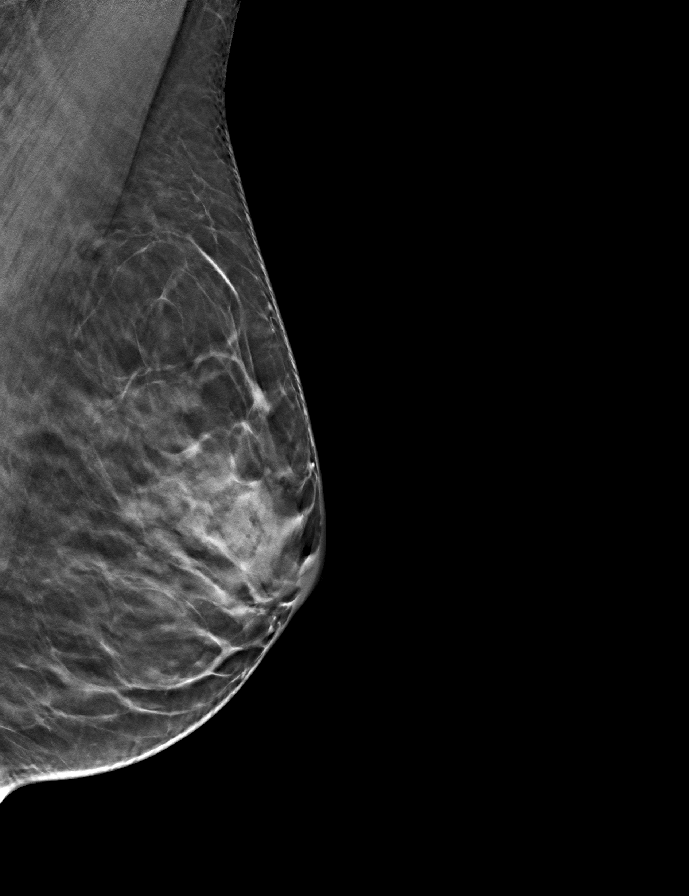

[9 of 24 positions shown; findings below may reference images not displayed]

ACR Breast Density Category c: The breast tissue is heterogeneously
dense, which may obscure small masses.
FINDINGS: There are no findings suspicious for malignancy.
IMPRESSION: No mammographic evidence of malignancy. A result letter of this
screening mammogram will be mailed directly to the patient.

RECOMMENDATION:
Screening mammogram in one year. (Code:Q3-W-BC3)

BI-RADS CATEGORY  1: Negative.

## 2022-04-01 ENCOUNTER — Other Ambulatory Visit (HOSPITAL_COMMUNITY): Payer: Self-pay | Admitting: Family Medicine

## 2022-04-01 DIAGNOSIS — Z1231 Encounter for screening mammogram for malignant neoplasm of breast: Secondary | ICD-10-CM

## 2022-04-08 ENCOUNTER — Ambulatory Visit (HOSPITAL_COMMUNITY)
Admission: RE | Admit: 2022-04-08 | Discharge: 2022-04-08 | Disposition: A | Discharge status: Home / Self Care | Source: Ambulatory Visit | Attending: Family Medicine | Admitting: Family Medicine

## 2022-04-08 DIAGNOSIS — Z1231 Encounter for screening mammogram for malignant neoplasm of breast: Secondary | ICD-10-CM | POA: Insufficient documentation

## 2022-04-16 ENCOUNTER — Ambulatory Visit
Admission: EM | Admit: 2022-04-16 | Discharge: 2022-04-16 | Disposition: A | Discharge status: Home / Self Care | Attending: Nurse Practitioner | Admitting: Nurse Practitioner

## 2022-04-16 DIAGNOSIS — L0201 Cutaneous abscess of face: Secondary | ICD-10-CM | POA: Diagnosis not present

## 2022-04-16 MED ORDER — DOXYCYCLINE HYCLATE 100 MG PO TABS: 100.0000 mg | ORAL_TABLET | Freq: Two times a day (BID) | ORAL | 0 refills | Status: AC

## 2022-04-16 NOTE — ED Triage Notes (Signed)
Pt reports a painful bump in the face x 2-3 days.

## 2022-04-16 NOTE — Discharge Instructions (Addendum)
Take medication as prescribed. Warm compresses to the affected area 3-4 times daily. Clean the area at least twice daily with Dial Gold bar soap or another type of antibacterial soap. Keep the area covered while it is draining. Follow-up in this clinic if you continue to experience pain is, increased swelling, or the area begins to look like a pimple. Go to the emergency department if you develop fever, chills, generalized fatigue, nausea, vomiting, or if the area of redness spreads into the jaw line or lower face, or if you have foul-smelling drainage.  Follow-up as needed.

## 2022-04-16 NOTE — ED Provider Notes (Signed)
[facility]    CSN: [id number] Arrival date & time: 04/16/22  0934      History   Chief Complaint Chief Complaint  Patient presents with   Abscess    HPI Sonia Lyons NAME is a 66 y.o. female.   The history is provided by the patient.   Patient reports with a possible abscess to the right lower face that started over the past couple of days.  Patient states that she was trying to remove an ingrown hair and since has developed an abscess.  She states that she has a history of the same.  She states the area has become more painful, and swollen, with redness.  She states that the area has drained some.  She has been using a cream to the area with minimal relief.  She denies fever, chills, chest pain, abdominal pain, nausea, vomiting, or diarrhea.  She states in the past she has received an antibiotic, which has taking care of the symptoms.  Past Medical History:  Diagnosis Date   Colon polyp    Hyperlipidemia    Migraine    Migraines   SVT (supraventricular tachycardia)     Patient Active Problem List   Diagnosis Date Noted   Migraine     Past Surgical History:  Procedure Laterality Date   ABDOMINAL HYSTERECTOMY     fibroids   Bone marrow removed from both hips     COLONOSCOPY     COLONOSCOPY N/A 02/09/2018   Procedure: COLONOSCOPY;  Surgeon: Daneil Dolin, MD;  [facility];  Service: Endoscopy;  Laterality: N/A;  9:45   FRACTURE SURGERY N/A    Phreesia 06/09/2020   Left wrist surgery     Right elbow surgery  12/26/2017   SINUS SURGERY WITH INSTATRAK      OB History   No obstetric history on file.      Home Medications    Prior to Admission medications   Medication Sig Start Date End Date Taking? Authorizing Provider  doxycycline (VIBRA-TABS) 100 MG tablet Take 1 tablet (100 mg total) by mouth 2 (two) times daily for 10 days. 04/16/22 04/26/22 Yes Zakeya Junker-Warren, Alda Lea, NP  MULTIPLE VITAMIN PO Take 1 tablet by mouth daily.      [provider]  Pumpkin Seed-Soy Germ (AZO BLADDER CONTROL/GO-LESS PO) Take by mouth. Patient not taking: Reported on 08/26/2021    [provider]  Rhubarb (ESTROVEN COMPLETE) 4 MG TABS Take by mouth. Patient not taking: Reported on 08/26/2021    [provider]  rosuvastatin (CRESTOR) 5 MG tablet Take 1 tablet (5 mg total) by mouth daily. Patient not taking: Reported on 08/26/2021 12/11/20   Susy Frizzle, MD    Family History Family History  Problem Relation Age of Onset   Hyperlipidemia Father    Diabetes Sister    Cancer Sister 43       breast    HIV Brother     Social History Social History   Tobacco Use   Smoking status: Former   Smokeless tobacco: Never  Scientific laboratory technician Use: Never used  Substance Use Topics   Alcohol use: Yes    Comment: Rare   Drug use: No     Allergies   Codeine   Review of Systems Review of Systems Per HPI  Physical Exam Triage Vital Signs ED Triage Vitals [04/16/22 1011]  Enc Vitals Group     BP 138/81     Pulse  Rate 90     Resp 16     Temp 98.5 F (36.9 C)     Temp Source Oral     SpO2 98 %     Weight      Height      Head Circumference      Peak Flow      Pain Score      Pain Loc      Pain Edu?      Excl. in [place]?    No data found.  Updated Vital Signs BP 138/81 (BP Location: Right Arm)   Pulse 90   Temp 98.5 F (36.9 C) (Oral)   Resp 16   SpO2 98%   Visual Acuity Right Eye Distance:   Left Eye Distance:   Bilateral Distance:    Right Eye Near:   Left Eye Near:    Bilateral Near:     Physical Exam Vitals and nursing note reviewed.  Constitutional:      General: She is not in acute distress.    Appearance: Normal appearance.  HENT:     Head: Normocephalic.     Jaw: Swelling (right jaw) and pain on movement (right jaw) present.      Comments: Induration noted to the right jawline that extends to the right lower chin.  Area is firm, nonfluctuant, without drainage.     Mouth/Throat:     Lips: Pink.  Pulmonary:     Effort: Pulmonary effort is normal.  Musculoskeletal:     Cervical back: Normal range of motion.  Lymphadenopathy:     Cervical: No cervical adenopathy.  Skin:    General: Skin is warm and dry.  Neurological:     General: No focal deficit present.     Mental Status: She is alert and oriented to person, place, and time.  Psychiatric:        Mood and Affect: Mood normal.        Behavior: Behavior normal.      UC Treatments / Results  Labs (all labs ordered are listed, but only abnormal results are displayed) Labs Reviewed - No data to display  EKG   Radiology No results found.  Procedures Procedures (including critical care time)  Medications Ordered in UC Medications - No data to display  Initial Impression / Assessment and Plan / UC Course  I have reviewed the triage vital signs and the nursing notes.  Pertinent labs & imaging results that were available during my care of the patient were reviewed by me and considered in my medical decision making (see chart for details).  Patient presents with skin abscess to the right lower jaw that extends to the right chin has been present for the past 2 days.  Patient's vital signs are stable, she is in no acute distress, she is well-appearing.  Symptoms are consistent with a skin abscess.  Will treat patient with doxycycline 100 mg.  Supportive care recommendations were provided to the patient to include warm compresses, cleaning the area with an antibacterial soap, and ibuprofen or Tylenol for pain or discomfort.  Patient was given strict ER precautions versus precautions of when to follow-up in this clinic.  Patient verbalizes understanding.  All questions were answered.  Patient is stable for discharge. Final Clinical Impressions(s) / UC Diagnoses   Final diagnoses:  Facial abscess     Discharge Instructions      Take medication as prescribed. Warm compresses to the affected  area 3-4 times daily. Clean  the area at least twice daily with Dial Gold bar soap or another type of antibacterial soap. Keep the area covered while it is draining. Follow-up in this clinic if you continue to experience pain is, increased swelling, or the area begins to look like a pimple. Go to the emergency department if you develop fever, chills, generalized fatigue, nausea, vomiting, or if the area of redness spreads into the jaw line or lower face, or if you have foul-smelling drainage.  Follow-up as needed.      ED Prescriptions     Medication Sig Dispense Auth. Provider   doxycycline (VIBRA-TABS) 100 MG tablet Take 1 tablet (100 mg total) by mouth 2 (two) times daily for 10 days. 20 tablet Rikia Sukhu-Warren, Alda Lea, NP      PDMP not reviewed this encounter.   Tish Men, NP 04/16/22 1056

## 2023-02-08 ENCOUNTER — Other Ambulatory Visit

## 2023-02-08 DIAGNOSIS — Z13220 Encounter for screening for lipoid disorders: Secondary | ICD-10-CM

## 2023-02-08 DIAGNOSIS — R079 Chest pain, unspecified: Secondary | ICD-10-CM

## 2023-02-09 LAB — COMPLETE METABOLIC PANEL WITH GFR
AG Ratio: 1.7 (calc) (ref 1.0–2.5)
ALT: 15 U/L (ref 6–29)
AST: 18 U/L (ref 10–35)
Albumin: 4.4 g/dL (ref 3.6–5.1)
Alkaline phosphatase (APISO): 71 U/L (ref 37–153)
BUN: 11 mg/dL (ref 7–25)
CO2: 30 mmol/L (ref 20–32)
Calcium: 9.7 mg/dL (ref 8.6–10.4)
Chloride: 102 mmol/L (ref 98–110)
Creat: 0.92 mg/dL (ref 0.50–1.05)
Globulin: 2.6 g/dL (calc) (ref 1.9–3.7)
Glucose, Bld: 82 mg/dL (ref 65–99)
Potassium: 4.2 mmol/L (ref 3.5–5.3)
Sodium: 140 mmol/L (ref 135–146)
Total Bilirubin: 0.6 mg/dL (ref 0.2–1.2)
Total Protein: 7 g/dL (ref 6.1–8.1)
eGFR: 69 mL/min/{1.73_m2} (ref >=60)

## 2023-02-09 LAB — LIPID PANEL
Cholesterol: 220 mg/dL — ABNORMAL HIGH
HDL: 67 mg/dL
LDL Cholesterol (Calc): 128 mg/dL — ABNORMAL HIGH
Non-HDL Cholesterol (Calc): 153 mg/dL — ABNORMAL HIGH
Total CHOL/HDL Ratio: 3.3 (calc)
Triglycerides: 142 mg/dL

## 2023-02-09 LAB — CBC WITH DIFFERENTIAL/PLATELET
Absolute Monocytes: 313 cells/uL (ref 200–950)
Basophils Absolute: 40 cells/uL (ref 0–200)
Basophils Relative: 1.1 %
Eosinophils Absolute: 40 cells/uL (ref 15–500)
Eosinophils Relative: 1.1 %
HCT: 40.2 % (ref 35.0–45.0)
Hemoglobin: 13.1 g/dL (ref 11.7–15.5)
Lymphs Abs: 1206 cells/uL (ref 850–3900)
MCH: 31 pg (ref 27.0–33.0)
MCHC: 32.6 g/dL (ref 32.0–36.0)
MCV: 95.3 fL (ref 80.0–100.0)
MPV: 10.2 fL (ref 7.5–12.5)
Monocytes Relative: 8.7 %
Neutro Abs: 2002 cells/uL (ref 1500–7800)
Neutrophils Relative %: 55.6 %
Platelets: 250 10*3/uL (ref 140–400)
RBC: 4.22 10*6/uL (ref 3.80–5.10)
RDW: 12.2 % (ref 11.0–15.0)
Total Lymphocyte: 33.5 %
WBC: 3.6 10*3/uL — ABNORMAL LOW (ref 3.8–10.8)

## 2023-02-13 ENCOUNTER — Ambulatory Visit (INDEPENDENT_AMBULATORY_CARE_PROVIDER_SITE_OTHER): Payer: Medicare Other | Admitting: Family Medicine

## 2023-02-13 ENCOUNTER — Encounter: Payer: Self-pay | Admitting: Family Medicine

## 2023-02-13 VITALS — BP 142/88 | HR 74 | Temp 98.3°F | Ht 73.0 in | Wt 158.2 lb

## 2023-02-13 DIAGNOSIS — Z0000 Encounter for general adult medical examination without abnormal findings: Secondary | ICD-10-CM

## 2023-02-13 DIAGNOSIS — D3500 Benign neoplasm of unspecified adrenal gland: Secondary | ICD-10-CM | POA: Diagnosis not present

## 2023-02-13 DIAGNOSIS — Z0001 Encounter for general adult medical examination with abnormal findings: Secondary | ICD-10-CM

## 2023-02-13 DIAGNOSIS — Z780 Asymptomatic menopausal state: Secondary | ICD-10-CM

## 2023-02-13 NOTE — Progress Notes (Signed)
Subjective:    Patient ID: Sonia Lyons, female    DOB: Feb 13, 1956, 67 y.o.   MRN: [id number]  HPI Patient is a very pleasant 67 year old Caucasian female here today for complete physical exam mammogram is due in November but she schedules this herself.  Her colonoscopy was performed in 2019 and is due again in 2029.  She gets her Pap smear performed at her gynecologist office.  She is due for a bone density test.  She is due for Pneumovax 23 as well as a flu shot and a COVID booster.  We discussed these today and the patient defers these.  She would like to get this at her pharmacy.  Otherwise she is doing well and no concern.  Her most recent lab work is listed below. Immunization History  Administered Date(s) Administered   DTaP 09/26/2011   Hepatitis A, Adult 12/17/1995   Influenza,inj,Quad PF,6+ Mos 04/17/2020   Influenza-Unspecified 04/07/1997, 03/17/1998   Moderna Sars-Covid-2 Vaccination 08/09/2019, 09/11/2019, 04/17/2020   OPV 07/15/1989   Smallpox 08/27/1979   Td 03/08/1982, 09/26/2011   Typhoid Parenteral 07/07/1995   Yellow Fever 06/12/1994   Zoster Recombinant(Shingrix) 12/17/2017, 06/03/2018    Lab on 02/08/2023  Component Date Value Ref Range Status   WBC 02/08/2023 3.6 (L)  3.8 - 10.8 Thousand/uL Final   RBC 02/08/2023 4.22  3.80 - 5.10 Million/uL Final   Hemoglobin 02/08/2023 13.1  11.7 - 15.5 g/dL Final   HCT 40/98/1191 40.2  35.0 - 45.0 % Final   MCV 02/08/2023 95.3  80.0 - 100.0 fL Final   MCH 02/08/2023 31.0  27.0 - 33.0 pg Final   MCHC 02/08/2023 32.6  32.0 - 36.0 g/dL Final   RDW 47/82/9562 12.2  11.0 - 15.0 % Final   Platelets 02/08/2023 250  140 - 400 Thousand/uL Final   MPV 02/08/2023 10.2  7.5 - 12.5 fL Final   Neutro Abs 02/08/2023 2,002  1,500 - 7,800 cells/uL Final   Lymphs Abs 02/08/2023 1,206  850 - 3,900 cells/uL Final   Absolute Monocytes 02/08/2023 313  200 - 950 cells/uL Final   Eosinophils Absolute 02/08/2023 40  15 - 500 cells/uL Final    Basophils Absolute 02/08/2023 40  0 - 200 cells/uL Final   Neutrophils Relative % 02/08/2023 55.6  % Final   Total Lymphocyte 02/08/2023 33.5  % Final   Monocytes Relative 02/08/2023 8.7  % Final   Eosinophils Relative 02/08/2023 1.1  % Final   Basophils Relative 02/08/2023 1.1  % Final   Glucose, Bld 02/08/2023 82  65 - 99 mg/dL Final   Comment: .            Fasting reference interval .    BUN 02/08/2023 11  7 - 25 mg/dL Final   Creat 13/12/6576 0.92  0.50 - 1.05 mg/dL Final   eGFR 46/96/2952 69  > OR = 60 mL/min/1.50m2 Final   BUN/Creatinine Ratio 02/08/2023 SEE NOTE:  6 - 22 (calc) Final   Comment:    Not Reported: BUN and Creatinine are within    reference range. .    Sodium 02/08/2023 140  135 - 146 mmol/L Final   Potassium 02/08/2023 4.2  3.5 - 5.3 mmol/L Final   Chloride 02/08/2023 102  98 - 110 mmol/L Final   CO2 02/08/2023 30  20 - 32 mmol/L Final   Calcium 02/08/2023 9.7  8.6 - 10.4 mg/dL Final   Total Protein 84/13/2440 7.0  6.1 - 8.1 g/dL Final   Albumin  02/08/2023 4.4  3.6 - 5.1 g/dL Final   Globulin 40/98/1191 2.6  1.9 - 3.7 g/dL (calc) Final   AG Ratio 02/08/2023 1.7  1.0 - 2.5 (calc) Final   Total Bilirubin 02/08/2023 0.6  0.2 - 1.2 mg/dL Final   Alkaline phosphatase (APISO) 02/08/2023 71  37 - 153 U/L Final   AST 02/08/2023 18  10 - 35 U/L Final   ALT 02/08/2023 15  6 - 29 U/L Final   Cholesterol 02/08/2023 220 (H)  <200 mg/dL Final   HDL 47/82/9562 67  > OR = 50 mg/dL Final   Triglycerides 13/12/6576 142  <150 mg/dL Final   LDL Cholesterol (Calc) 02/08/2023 128 (H)  mg/dL (calc) Final   Comment: Reference range: <100 . Desirable range <100 mg/dL for primary prevention;   <70 mg/dL for patients with CHD or diabetic patients  with > or = 2 CHD risk factors. Marland Kitchen LDL-C is now calculated using the Martin-Hopkins  calculation, which is a validated novel method providing  better accuracy than the Friedewald equation in the  estimation of LDL-C.  Horald Pollen et al.  Lenox Ahr. 4696;295(28): 2061-2068  (http://education.[employer].com/faq/FAQ164)    Total CHOL/HDL Ratio 02/08/2023 3.3  <4.1 (calc) Final   Non-HDL Cholesterol (Calc) 02/08/2023 153 (H)  <130 mg/dL (calc) Final   Comment: For patients with diabetes plus 1 major ASCVD risk  factor, treating to a non-HDL-C goal of <100 mg/dL  (LDL-C of <32 mg/dL) is considered a therapeutic  option.    Past Medical History:  Diagnosis Date   Colon polyp    Hyperlipidemia    Migraine    Migraines   SVT (supraventricular tachycardia)    Past Surgical History:  Procedure Laterality Date   ABDOMINAL HYSTERECTOMY     fibroids   Bone marrow removed from both hips     COLONOSCOPY     COLONOSCOPY N/A 02/09/2018   Procedure: COLONOSCOPY;  Surgeon: Corbin Ade, MD;  Location: [facility];  Service: Endoscopy;  Laterality: N/A;  9:45   FRACTURE SURGERY N/A    Phreesia 06/09/2020   Left wrist surgery     Right elbow surgery  12/26/2017   SINUS SURGERY WITH INSTATRAK     Current Outpatient Medications on File Prior to Visit  Medication Sig Dispense Refill   Black Cohosh 20 MG TABS Take by mouth.     Cranberry-Vitamin C-Probiotic (AZO CRANBERRY) 250-30 MG TABS Take by mouth.     MULTIPLE VITAMIN PO Take 1 tablet by mouth daily.      Pumpkin Seed-Soy Germ (AZO BLADDER CONTROL/GO-LESS PO) Take by mouth. (Patient not taking: Reported on 08/26/2021)     Rhubarb (ESTROVEN COMPLETE) 4 MG TABS Take by mouth. (Patient not taking: Reported on 08/26/2021)     No current facility-administered medications on file prior to visit.   Allergies  Allergen Reactions   Codeine Nausea Only and Other (See Comments)   Social History   Socioeconomic History   Marital status: Married    Spouse name: Not on file   Number of children: Not on file   Years of education: Not on file   Highest education level: Not on file  Occupational History   Not on file  Tobacco Use   Smoking status: Former   Smokeless tobacco:  Never  Vaping Use   Vaping status: Never Used  Substance and Sexual Activity   Alcohol use: Yes    Comment: Rare   Drug use: No   Sexual activity: Yes  Comment: married to [place]  Other Topics Concern   Not on file  Social History Narrative   Not on file   Social Determinants of Health   Financial Resource Strain: Not on file  Food Insecurity: Not on file  Transportation Needs: Not on file  Physical Activity: Not on file  Stress: Not on file  Social Connections: Not on file  Intimate Partner Violence: Not on file   Family History  Problem Relation Age of Onset   Hyperlipidemia Father    Diabetes Sister    Cancer Sister 41       breast    HIV Brother    Mom died suddenly at 73 either to myocardial infarction or possibly pulmonary embolism   Review of Systems  All other systems reviewed and are negative.      Objective:   Physical Exam Vitals reviewed.  Constitutional:      General: She is not in acute distress.    Appearance: She is well-developed. She is not diaphoretic.  HENT:     Head: Normocephalic and atraumatic.     Right Ear: External ear normal.     Left Ear: External ear normal.     Nose: Nose normal.     Mouth/Throat:     Pharynx: No oropharyngeal exudate.  Eyes:     General: No scleral icterus.       Right eye: No discharge.        Left eye: No discharge.     Conjunctiva/sclera: Conjunctivae normal.     Pupils: Pupils are equal, round, and reactive to light.  Neck:     Thyroid: No thyromegaly.     Vascular: No JVD.     Trachea: No tracheal deviation.  Cardiovascular:     Rate and Rhythm: Normal rate and regular rhythm.     Heart sounds: Normal heart sounds. No murmur heard.    No friction rub. No gallop.  Pulmonary:     Effort: Pulmonary effort is normal. No respiratory distress.     Breath sounds: Normal breath sounds. No stridor. No wheezing or rales.  Chest:     Chest wall: No tenderness.  Abdominal:     General: Bowel sounds are  normal. There is no distension.     Palpations: Abdomen is soft. There is no mass.     Tenderness: There is no abdominal tenderness. There is no guarding or rebound.  Musculoskeletal:        General: No tenderness or deformity. Normal range of motion.     Cervical back: Normal range of motion and neck supple.  Lymphadenopathy:     Cervical: No cervical adenopathy.  Skin:    General: Skin is warm.     Coloration: Skin is not pale.     Findings: No erythema or rash.  Neurological:     Mental Status: She is alert and oriented to person, place, and time.     Cranial Nerves: No cranial nerve deficit.     Motor: No abnormal muscle tone.     Coordination: Coordination normal.     Deep Tendon Reflexes: Reflexes are normal and symmetric.  Psychiatric:        Behavior: Behavior normal.        Thought Content: Thought content normal.        Judgment: Judgment normal.           Assessment & Plan:  Adrenal adenoma, unspecified laterality - Plan: CT ABDOMEN PELVIS W CONTRAST  Postmenopausal  estrogen deficiency - Plan: DG Bone Density  General medical exam Patient elevated cholesterol 2 years ago she had a coronary calcium score of 0.  Therefore we have decided to treat this with fish oil 2000 mg daily.  Her mammogram will be due in November.  Colonoscopy is up-to-date.  I will schedule her for a bone density test.  She prefers her gynecologist performed a Pap smear.  Recommended Pneumovax 23, flu shot, and a COVID booster.  She defers these at the present time.  Last year on the CAT scan she had a coincidental finding of an adrenal adenoma.  They recommended a repeat CT scan to monitor in 1 year for any growth.  I will schedule that at this time.

## 2023-03-24 ENCOUNTER — Ambulatory Visit (HOSPITAL_COMMUNITY)
Admission: RE | Admit: 2023-03-24 | Discharge: 2023-03-24 | Disposition: A | Discharge status: Home / Self Care | Payer: Medicare Other | Source: Ambulatory Visit | Attending: Family Medicine | Admitting: Family Medicine

## 2023-03-24 DIAGNOSIS — D3502 Benign neoplasm of left adrenal gland: Secondary | ICD-10-CM | POA: Diagnosis not present

## 2023-03-24 DIAGNOSIS — D3500 Benign neoplasm of unspecified adrenal gland: Secondary | ICD-10-CM

## 2023-03-24 DIAGNOSIS — R160 Hepatomegaly, not elsewhere classified: Secondary | ICD-10-CM | POA: Insufficient documentation

## 2023-03-24 MED ORDER — IOHEXOL 300 MG/ML  SOLN
100.0000 mL | Freq: Once | INTRAMUSCULAR | Status: AC | PRN
  Administered 2023-03-24: 100 mL via INTRAVENOUS

## 2023-03-30 ENCOUNTER — Other Ambulatory Visit: Payer: Self-pay | Admitting: Family Medicine

## 2023-03-30 DIAGNOSIS — R160 Hepatomegaly, not elsewhere classified: Secondary | ICD-10-CM

## 2023-03-30 DIAGNOSIS — K769 Liver disease, unspecified: Secondary | ICD-10-CM

## 2023-03-31 ENCOUNTER — Encounter: Payer: Self-pay | Admitting: Family Medicine

## 2023-04-11 ENCOUNTER — Other Ambulatory Visit (HOSPITAL_COMMUNITY): Payer: Self-pay | Admitting: Family Medicine

## 2023-04-11 DIAGNOSIS — Z1231 Encounter for screening mammogram for malignant neoplasm of breast: Secondary | ICD-10-CM

## 2023-04-14 ENCOUNTER — Ambulatory Visit (HOSPITAL_COMMUNITY)
Admission: RE | Admit: 2023-04-14 | Discharge: 2023-04-14 | Disposition: A | Discharge status: Home / Self Care | Payer: Medicare Other | Source: Ambulatory Visit | Attending: Family Medicine | Admitting: Family Medicine

## 2023-04-14 DIAGNOSIS — Z780 Asymptomatic menopausal state: Secondary | ICD-10-CM | POA: Insufficient documentation

## 2023-04-15 ENCOUNTER — Encounter: Payer: Self-pay | Admitting: Family Medicine

## 2023-04-17 ENCOUNTER — Encounter (HOSPITAL_COMMUNITY): Payer: Self-pay

## 2023-04-17 ENCOUNTER — Ambulatory Visit (HOSPITAL_COMMUNITY)
Admission: RE | Admit: 2023-04-17 | Discharge: 2023-04-17 | Disposition: A | Discharge status: Home / Self Care | Payer: Medicare Other | Source: Ambulatory Visit | Attending: Family Medicine | Admitting: Family Medicine

## 2023-04-17 DIAGNOSIS — Z1231 Encounter for screening mammogram for malignant neoplasm of breast: Secondary | ICD-10-CM | POA: Diagnosis present

## 2023-04-21 ENCOUNTER — Ambulatory Visit (HOSPITAL_COMMUNITY)
Admission: RE | Admit: 2023-04-21 | Discharge: 2023-04-21 | Disposition: A | Discharge status: Home / Self Care | Payer: Medicare Other | Source: Ambulatory Visit | Attending: Family Medicine | Admitting: Family Medicine

## 2023-04-21 DIAGNOSIS — K769 Liver disease, unspecified: Secondary | ICD-10-CM | POA: Diagnosis present

## 2023-04-21 DIAGNOSIS — R160 Hepatomegaly, not elsewhere classified: Secondary | ICD-10-CM | POA: Insufficient documentation

## 2023-04-21 MED ORDER — GADOBUTROL 1 MMOL/ML IV SOLN
7.1000 mL | Freq: Once | INTRAVENOUS | Status: AC | PRN
  Administered 2023-04-21: 7.1 mL via INTRAVENOUS

## 2023-06-21 ENCOUNTER — Telehealth: Payer: Self-pay | Admitting: Family Medicine

## 2023-06-21 NOTE — Telephone Encounter (Signed)
Left message to return call to schedule Welcome To Medicare appointment with Dr. Tanya Nones prior to 10/22/2023.

## 2023-06-23 NOTE — Telephone Encounter (Signed)
Second attempt to schedule Welcome to [facility] appointment; Left message to return call.

## 2023-09-28 ENCOUNTER — Encounter: Payer: Self-pay | Admitting: Family Medicine

## 2023-09-28 ENCOUNTER — Ambulatory Visit (INDEPENDENT_AMBULATORY_CARE_PROVIDER_SITE_OTHER): Payer: TRICARE For Life (TFL) | Admitting: Family Medicine

## 2023-09-28 VITALS — BP 120/70 | HR 78 | Ht 73.0 in | Wt 158.8 lb

## 2023-09-28 DIAGNOSIS — E7800 Pure hypercholesterolemia, unspecified: Secondary | ICD-10-CM | POA: Diagnosis not present

## 2023-09-28 DIAGNOSIS — Z0001 Encounter for general adult medical examination with abnormal findings: Secondary | ICD-10-CM | POA: Diagnosis not present

## 2023-09-28 DIAGNOSIS — Z0000 Encounter for general adult medical examination without abnormal findings: Secondary | ICD-10-CM

## 2023-09-28 NOTE — Progress Notes (Signed)
 Subjective:    Sonia Lyons is a 68 y.o. female who presents for a Welcome to Medicare exam.   Cardiac Risk Factors include: advanced age (>56men, >25 women);dyslipidemia      Objective:     Today's Vitals   09/28/23 0926  Pulse: 78  SpO2: 99%  Weight: 158 lb 12.8 oz (72 kg)  Height: 6\' 1"  (1.854 m)  Body mass index is 20.95 kg/m.  Medications Outpatient Encounter Medications as of 09/28/2023  Medication Sig   Cranberry-Vitamin C-Probiotic (AZO CRANBERRY) 250-30 MG TABS Take by mouth.   Rhubarb (ESTROVEN COMPLETE) 4 MG TABS Take by mouth.   MULTIPLE VITAMIN PO Take 1 tablet by mouth daily.  (Patient not taking: Reported on 09/28/2023)   [DISCONTINUED] Black Cohosh 20 MG TABS Take by mouth.   [DISCONTINUED] Pumpkin Seed-Soy Germ (AZO BLADDER CONTROL/GO-LESS PO) Take by mouth. (Patient not taking: Reported on 08/26/2021)   No facility-administered encounter medications on file as of 09/28/2023.     History: Past Medical History:  Diagnosis Date   Colon polyp    Hyperlipidemia    Migraine    Migraines   Osteopenia    SVT (supraventricular tachycardia) (HCC)    Past Surgical History:  Procedure Laterality Date   ABDOMINAL HYSTERECTOMY     fibroids   Bone marrow removed from both hips     COLONOSCOPY     COLONOSCOPY N/A 02/09/2018   Procedure: COLONOSCOPY;  Surgeon: Suzette Espy, MD;  [facility];  Service: Endoscopy;  Laterality: N/A;  9:45   FRACTURE SURGERY N/A    Phreesia 06/09/2020   Left wrist surgery     Right elbow surgery  12/26/2017   SINUS SURGERY WITH INSTATRAK      Family History  Problem Relation Age of Onset   Hyperlipidemia Father    Diabetes Sister    Cancer Sister 64       breast    HIV Brother    Social History   Occupational History   Not on file  Tobacco Use   Smoking status: Former   Smokeless tobacco: Never  Vaping Use   Vaping status: Never Used  Substance and Sexual Activity   Alcohol use: Yes    Comment: Rare    Drug use: No   Sexual activity: Yes    Comment: married to [place]    Tobacco Counseling Counseling given: Not Answered   Immunizations and Health Maintenance Immunization History  Administered Date(s) Administered   DTaP 09/26/2011   Hepatitis A, Adult 12/17/1995   Influenza,inj,Quad PF,6+ Mos 04/17/2020   Influenza-Unspecified 04/07/1997, 03/17/1998   Moderna Sars-Covid-2 Vaccination 08/09/2019, 09/11/2019, 04/17/2020   OPV 07/15/1989   Smallpox 08/27/1979   Td 03/08/1982, 09/26/2011   Typhoid Parenteral 07/07/1995   Yellow Fever 06/12/1994   Zoster Recombinant(Shingrix) 12/17/2017, 06/03/2018   Health Maintenance Due  Topic Date Due   Cervical Cancer Screening (HPV/Pap Cotest)  06/19/2010   COVID-19 Vaccine (4 - 2024-25 season) 01/22/2023    Activities of Daily Living    09/28/2023    9:36 AM 09/26/2023    2:38 PM  In your present state of health, do you have any difficulty performing the following activities:  Hearing? 0 0  Vision? 0 0  Difficulty concentrating or making decisions? 0 0  Walking or climbing stairs? 0 0  Dressing or bathing? 0 0  Doing errands, shopping? 0 0  Preparing Food and eating ? N N  Using the Toilet? N N  In the past six months, have you accidently leaked urine? N N  Do you have problems with loss of bowel control? N N  Managing your Medications? N N  Managing your Finances? N N  Housekeeping or managing your Housekeeping? N N    Physical Exam   Physical Exam (optional), or other factors deemed appropriate based on the beneficiary's medical and social history and current clinical standards.   Advanced Directives: Does Patient Have a Medical Advance Directive?: Yes Type of Advance Directive: Healthcare Power of Attorney, Living will Copy of Healthcare Power of Attorney in Chart?: No - copy requested  EKG:        Assessment:     This is a routine wellness examination for this patient . 09/28/2023  Vision/Hearing  screen Hearing Screening - Comments:: No hearing issues.  Vision Screening - Comments:: [facility], [place].    Goals      Exercise 150 min/wk Moderate Activity     Continue to exercise and stay healthy. Get yard completely weeded.         Depression Screen    09/28/2023    9:32 AM 02/13/2023    9:28 AM 06/12/2020   10:42 AM 11/03/2017    8:22 AM  PHQ 2/9 Scores  PHQ - 2 Score 0 0 0 0  PHQ- 9 Score   0      Fall Risk    09/28/2023    9:35 AM  Fall Risk   Falls in the past year? 0  Number falls in past yr: 0  Injury with Fall? 0  Risk for fall due to : No Fall Risks  Follow up Falls prevention discussed;Falls evaluation completed    Cognitive Function:        09/28/2023    9:36 AM  6CIT Screen  What Year? 0 points  What month? 0 points  What time? 0 points  Count back from 20 0 points  Months in reverse 0 points  Repeat phrase 0 points  Total Score 0 points    Patient Care Team: Austine Lefort, MD as PCP - General (Family Medicine)     Plan:  Encounter for Medicare annual wellness exam  General medical exam  Pure hypercholesterolemia - Plan: CBC with Differential/Platelet, COMPLETE METABOLIC PANEL WITHOUT GFR, Lipid panel   I have personally reviewed and noted the following in the patient's chart:   Medical and social history Use of alcohol, tobacco or illicit drugs  Current medications and supplements including opioid prescriptions. Patient is not currently taking opioid prescriptions. Functional ability and status Nutritional status Physical activity Advanced directives List of other physicians Hospitalizations, surgeries, and ER visits in previous 12 months Vitals Screenings to include cognitive, depression, and falls Referrals and appointments  In addition, I have reviewed and discussed with patient certain preventive protocols, quality metrics, and best practice recommendations. A written personalized care plan for  preventive services as well as general preventive health recommendations were provided to patient.     Verneda Golder, LPN 05/28/1094   I have collaborated with the care management provider regarding care management and care coordination activities outlined in this encounter and have reviewed this encounter including documentation in the note and care plan. I am certifying that I agree with the content of this note and encounter as supervising physician.

## 2023-09-28 NOTE — Progress Notes (Signed)
 Subjective:    Patient ID: Sonia Lyons, female    DOB: 10-27-1955, 68 y.o.   MRN: [id number]  HPI Patient is a very pleasant 68 year old Caucasian female here today for complete physical exam.  Patient had a CT scan to monitor adrenal adenoma in November last year.  This was stable and they recommended no further imaging.  However they saw a lesion on her liver.  An MRI determined that this was a benign hepatic hemangioma.  Her last mammogram was in November 2024.  Her last colonoscopy in 2019.  Colonoscopy is not due again until 2029.  She is due for the pneumonia vaccine which she politely declines.  She has been having crampy left lower quadrant abdominal pain occasionally.  It lasts for few minutes and then subsides.  CT scan in November showed no evidence of diverticulosis.  I suspect intestinal spasms.  She denies any melena or hematochezia. Immunization History  Administered Date(s) Administered   DTaP 09/26/2011   Hepatitis A, Adult 12/17/1995   Influenza,inj,Quad PF,6+ Mos 04/17/2020   Influenza-Unspecified 04/07/1997, 03/17/1998   Moderna Sars-Covid-2 Vaccination 08/09/2019, 09/11/2019, 04/17/2020   OPV 07/15/1989   Smallpox 08/27/1979   Td 03/08/1982, 09/26/2011   Typhoid Parenteral 07/07/1995   Yellow Fever 06/12/1994   Zoster Recombinant(Shingrix) 12/17/2017, 06/03/2018     Past Medical History:  Diagnosis Date   Colon polyp    Hyperlipidemia    Migraine    Migraines   Osteopenia    SVT (supraventricular tachycardia) (HCC)    Past Surgical History:  Procedure Laterality Date   ABDOMINAL HYSTERECTOMY     fibroids   Bone marrow removed from both hips     COLONOSCOPY     COLONOSCOPY N/A 02/09/2018   Procedure: COLONOSCOPY;  Surgeon: Suzette Espy, MD;  Location: [facility];  Service: Endoscopy;  Laterality: N/A;  9:45   FRACTURE SURGERY N/A    Phreesia 06/09/2020   Left wrist surgery     Right elbow surgery  12/26/2017   SINUS SURGERY WITH INSTATRAK      Current Outpatient Medications on File Prior to Visit  Medication Sig Dispense Refill   Cranberry-Vitamin C-Probiotic (AZO CRANBERRY) 250-30 MG TABS Take by mouth.     Rhubarb (ESTROVEN COMPLETE) 4 MG TABS Take by mouth.     MULTIPLE VITAMIN PO Take 1 tablet by mouth daily.  (Patient not taking: Reported on 09/28/2023)     No current facility-administered medications on file prior to visit.   Allergies  Allergen Reactions   Codeine Nausea Only and Other (See Comments)   Social History   Socioeconomic History   Marital status: Married    Spouse name: Not on file   Number of children: Not on file   Years of education: Not on file   Highest education level: Not on file  Occupational History   Not on file  Tobacco Use   Smoking status: Former   Smokeless tobacco: Never  Vaping Use   Vaping status: Never Used  Substance and Sexual Activity   Alcohol use: Yes    Comment: Rare   Drug use: No   Sexual activity: Yes    Comment: married to [place]  Other Topics Concern   Not on file  Social History Narrative   Not on file   Social Drivers of Health   Financial Resource Strain: Low Risk  (09/28/2023)   Overall Financial Resource Strain (CARDIA)    Difficulty of Paying Living Expenses: Not hard at  all  Food Insecurity: No Food Insecurity (09/28/2023)   Hunger Vital Sign    Worried About Running Out of Food in the Last Year: Never true    Ran Out of Food in the Last Year: Never true  Transportation Needs: No Transportation Needs (09/28/2023)   PRAPARE - Administrator, Civil Service (Medical): No    Lack of Transportation (Non-Medical): No  Physical Activity: Sufficiently Active (09/28/2023)   Exercise Vital Sign    Days of Exercise per Week: 7 days    Minutes of Exercise per Session: 40 min  Stress: No Stress Concern Present (09/28/2023)   [employer] of Occupational Health - Occupational Stress Questionnaire    Feeling of Stress : Not at all  Social Connections:  Socially Integrated (09/28/2023)   Social Connection and Isolation Panel [NHANES]    Frequency of Communication with Friends and Family: More than three times a week    Frequency of Social Gatherings with Friends and Family: More than three times a week    Attends Religious Services: 1 to 4 times per year    Active Member of [employer] or Organizations: Yes    Attends Engineer, structural: More than 4 times per year    Marital Status: Married  Catering manager Violence: Not At Risk (09/28/2023)   Humiliation, Afraid, Rape, and Kick questionnaire    Fear of Current or Ex-Partner: No    Emotionally Abused: No    Physically Abused: No    Sexually Abused: No   Family History  Problem Relation Age of Onset   Hyperlipidemia Father    Diabetes Sister    Cancer Sister 92       breast    HIV Brother    Mom died suddenly at 88 either to myocardial infarction or possibly pulmonary embolism   Review of Systems  All other systems reviewed and are negative.      Objective:   Physical Exam Vitals reviewed.  Constitutional:      General: She is not in acute distress.    Appearance: She is well-developed. She is not diaphoretic.  HENT:     Head: Normocephalic and atraumatic.     Right Ear: External ear normal.     Left Ear: External ear normal.     Nose: Nose normal.     Mouth/Throat:     Pharynx: No oropharyngeal exudate.  Eyes:     General: No scleral icterus.       Right eye: No discharge.        Left eye: No discharge.     Conjunctiva/sclera: Conjunctivae normal.     Pupils: Pupils are equal, round, and reactive to light.  Neck:     Thyroid: No thyromegaly.     Vascular: No JVD.     Trachea: No tracheal deviation.  Cardiovascular:     Rate and Rhythm: Normal rate and regular rhythm.     Heart sounds: Normal heart sounds. No murmur heard.    No friction rub. No gallop.  Pulmonary:     Effort: Pulmonary effort is normal. No respiratory distress.     Breath sounds: Normal  breath sounds. No stridor. No wheezing or rales.  Chest:     Chest wall: No tenderness.  Abdominal:     General: Bowel sounds are normal. There is no distension.     Palpations: Abdomen is soft. There is no mass.     Tenderness: There is no abdominal tenderness. There is  no guarding or rebound.  Musculoskeletal:        General: No tenderness or deformity. Normal range of motion.     Cervical back: Normal range of motion and neck supple.  Lymphadenopathy:     Cervical: No cervical adenopathy.  Skin:    General: Skin is warm.     Coloration: Skin is not pale.     Findings: No erythema or rash.  Neurological:     Mental Status: She is alert and oriented to person, place, and time.     Cranial Nerves: No cranial nerve deficit.     Motor: No abnormal muscle tone.     Coordination: Coordination normal.     Deep Tendon Reflexes: Reflexes are normal and symmetric.  Psychiatric:        Behavior: Behavior normal.        Thought Content: Thought content normal.        Judgment: Judgment normal.           Assessment & Plan:  Encounter for Medicare annual wellness exam  Return fasting for CBC CMP and lipid panel.  We discussed her situation.  If LDL cholesterol is greater than 130 she would like to start statin.  She can also use fish oil 2000 mg a day to lower her cholesterol.  I recommended a pneumonia vaccine which she declined.  Mammogram is due in November.  Colonoscopy is up-to-date.  Bone density test is up-to-date

## 2023-10-03 ENCOUNTER — Other Ambulatory Visit

## 2023-10-03 LAB — COMPLETE METABOLIC PANEL WITHOUT GFR
AG Ratio: 1.9 (calc) (ref 1.0–2.5)
ALT: 12 U/L (ref 6–29)
AST: 16 U/L (ref 10–35)
Albumin: 4.3 g/dL (ref 3.6–5.1)
Alkaline phosphatase (APISO): 59 U/L (ref 37–153)
BUN: 15 mg/dL (ref 7–25)
CO2: 29 mmol/L (ref 20–32)
Calcium: 9.5 mg/dL (ref 8.6–10.4)
Chloride: 102 mmol/L (ref 98–110)
Creat: 0.9 mg/dL (ref 0.50–1.05)
Globulin: 2.3 g/dL (ref 1.9–3.7)
Glucose, Bld: 81 mg/dL (ref 65–99)
Potassium: 4.3 mmol/L (ref 3.5–5.3)
Sodium: 139 mmol/L (ref 135–146)
Total Bilirubin: 0.7 mg/dL (ref 0.2–1.2)
Total Protein: 6.6 g/dL (ref 6.1–8.1)

## 2023-10-03 LAB — LIPID PANEL
Cholesterol: 226 mg/dL — ABNORMAL HIGH (ref <200)
HDL: 72 mg/dL (ref >=50)
LDL Cholesterol (Calc): 133 mg/dL — ABNORMAL HIGH
Non-HDL Cholesterol (Calc): 154 mg/dL — ABNORMAL HIGH (ref <130)
Total CHOL/HDL Ratio: 3.1 (calc) (ref <5.0)
Triglycerides: 104 mg/dL (ref <150)

## 2023-10-03 LAB — CBC WITH DIFFERENTIAL/PLATELET
Absolute Lymphocytes: 1115 {cells}/uL (ref 850–3900)
Absolute Monocytes: 381 {cells}/uL (ref 200–950)
Basophils Absolute: 21 {cells}/uL (ref 0–200)
Basophils Relative: 0.5 %
Eosinophils Absolute: 49 {cells}/uL (ref 15–500)
Eosinophils Relative: 1.2 %
HCT: 39 % (ref 35.0–45.0)
Hemoglobin: 12.5 g/dL (ref 11.7–15.5)
MCH: 30.2 pg (ref 27.0–33.0)
MCHC: 32.1 g/dL (ref 32.0–36.0)
MCV: 94.2 fL (ref 80.0–100.0)
MPV: 10.2 fL (ref 7.5–12.5)
Monocytes Relative: 9.3 %
Neutro Abs: 2534 {cells}/uL (ref 1500–7800)
Neutrophils Relative %: 61.8 %
Platelets: 237 10*3/uL (ref 140–400)
RBC: 4.14 10*6/uL (ref 3.80–5.10)
RDW: 12 % (ref 11.0–15.0)
Total Lymphocyte: 27.2 %
WBC: 4.1 10*3/uL (ref 3.8–10.8)

## 2023-10-05 ENCOUNTER — Ambulatory Visit: Payer: Self-pay | Admitting: Family Medicine

## 2023-10-10 ENCOUNTER — Other Ambulatory Visit

## 2023-12-26 ENCOUNTER — Ambulatory Visit (INDEPENDENT_AMBULATORY_CARE_PROVIDER_SITE_OTHER): Admitting: Family Medicine

## 2023-12-26 VITALS — BP 138/80 | HR 71 | Temp 98.3°F | Ht 73.0 in | Wt 163.0 lb

## 2023-12-26 DIAGNOSIS — G43109 Migraine with aura, not intractable, without status migrainosus: Secondary | ICD-10-CM

## 2023-12-26 NOTE — Progress Notes (Signed)
 Subjective:    Patient ID: Sonia Lyons, female    DOB: 15-Feb-1956, 68 y.o.   MRN: [id number]  HPI   Patient has a history of migraines.  She states that for the last week, she been getting patches of light and then under her left eye.  Describes it as an elongated bright rectangle at the corner of the field of her vision in the left eye especially if she turns her eyes to the left.  She denies any scotoma.  She denies any vision loss.  She denies any visual field deficit.  On funduscopic exam, I do not appreciate any retinal tear.  She denies any floaters.  Around the same time, the patient has been dealing with a dull constant headache over the last week. Past Medical History:  Diagnosis Date   Colon polyp    Hyperlipidemia    Migraine    Migraines   Osteopenia    SVT (supraventricular tachycardia) (HCC)    Past Surgical History:  Procedure Laterality Date   ABDOMINAL HYSTERECTOMY     fibroids   Bone marrow removed from both hips     COLONOSCOPY     COLONOSCOPY N/A 02/09/2018   Procedure: COLONOSCOPY;  Surgeon: Shaaron Lamar HERO, MD;  Location: [facility];  Service: Endoscopy;  Laterality: N/A;  9:45   FRACTURE SURGERY N/A    Phreesia 06/09/2020   Left wrist surgery     Right elbow surgery  12/26/2017   SINUS SURGERY WITH INSTATRAK     Current Outpatient Medications on File Prior to Visit  Medication Sig Dispense Refill   Cranberry-Vitamin C-Probiotic (AZO CRANBERRY) 250-30 MG TABS Take by mouth.     MULTIPLE VITAMIN PO Take 1 tablet by mouth daily.  (Patient not taking: Reported on 09/28/2023)     Rhubarb (ESTROVEN COMPLETE) 4 MG TABS Take by mouth.     No current facility-administered medications on file prior to visit.   Allergies  Allergen Reactions   Codeine Nausea Only and Other (See Comments)   Social History   Socioeconomic History   Marital status: Married    Spouse name: Not on file   Number of children: Not on file   Years of education: Not on file    Highest education level: Bachelor's degree (e.g., BA, AB, BS)  Occupational History   Not on file  Tobacco Use   Smoking status: Former   Smokeless tobacco: Never  Vaping Use   Vaping status: Never Used  Substance and Sexual Activity   Alcohol use: Yes    Comment: Rare   Drug use: No   Sexual activity: Yes    Comment: married to [place]  Other Topics Concern   Not on file  Social History Narrative   Not on file   Social Drivers of Health   Financial Resource Strain: Low Risk  (12/26/2023)   Overall Financial Resource Strain (CARDIA)    Difficulty of Paying Living Expenses: Not hard at all  Food Insecurity: No Food Insecurity (12/26/2023)   Hunger Vital Sign    Worried About Running Out of Food in the Last Year: Never true    Ran Out of Food in the Last Year: Never true  Transportation Needs: No Transportation Needs (12/26/2023)   PRAPARE - Administrator, Civil Service (Medical): No    Lack of Transportation (Non-Medical): No  Physical Activity: Sufficiently Active (12/26/2023)   Exercise Vital Sign    Days of Exercise per Week: 7  days    Minutes of Exercise per Session: 60 min  Stress: No Stress Concern Present (12/26/2023)   [employer] of Occupational Health - Occupational Stress Questionnaire    Feeling of Stress: Not at all  Social Connections: Unknown (12/26/2023)   Social Connection and Isolation Panel    Frequency of Communication with Friends and Family: More than three times a week    Frequency of Social Gatherings with Friends and Family: More than three times a week    Attends Religious Services: Not on Insurance claims handler of Clubs or Organizations: Yes    Attends Banker Meetings: More than 4 times per year    Marital Status: Married  Catering manager Violence: Not At Risk (09/28/2023)   Humiliation, Afraid, Rape, and Kick questionnaire    Fear of Current or Ex-Partner: No    Emotionally Abused: No    Physically Abused: No    Sexually  Abused: No   Family History  Problem Relation Age of Onset   Hyperlipidemia Father    Diabetes Sister    Cancer Sister 37       breast    HIV Brother    Mom died suddenly at 41 either to myocardial infarction or possibly pulmonary embolism   Review of Systems  All other systems reviewed and are negative.      Objective:   Physical Exam Vitals reviewed.  Constitutional:      General: She is not in acute distress.    Appearance: She is well-developed. She is not diaphoretic.  HENT:     Head: Normocephalic and atraumatic.     Right Ear: External ear normal.     Left Ear: External ear normal.     Nose: Nose normal.     Mouth/Throat:     Pharynx: No oropharyngeal exudate.  Eyes:     General: Lids are normal. No scleral icterus.       Right eye: No discharge.        Left eye: No discharge.     Extraocular Movements:     Right eye: Normal extraocular motion.     Left eye: Normal extraocular motion.     Conjunctiva/sclera: Conjunctivae normal.     Pupils: Pupils are equal, round, and reactive to light.     Funduscopic exam:    Right eye: No hemorrhage or papilledema. Red reflex present.        Left eye: No hemorrhage or papilledema. Red reflex present. Neck:     Thyroid: No thyromegaly.     Vascular: No JVD.     Trachea: No tracheal deviation.  Cardiovascular:     Rate and Rhythm: Normal rate and regular rhythm.     Heart sounds: Normal heart sounds. No murmur heard.    No friction rub. No gallop.  Pulmonary:     Effort: Pulmonary effort is normal. No respiratory distress.     Breath sounds: Normal breath sounds. No stridor. No wheezing or rales.  Chest:     Chest wall: No tenderness.  Abdominal:     General: Bowel sounds are normal. There is no distension.     Palpations: Abdomen is soft. There is no mass.     Tenderness: There is no abdominal tenderness. There is no guarding or rebound.  Musculoskeletal:        General: No tenderness or deformity. Normal range  of motion.     Cervical back: Normal range of motion and neck supple.  Lymphadenopathy:     Cervical: No cervical adenopathy.  Skin:    General: Skin is warm.     Coloration: Skin is not pale.     Findings: No erythema or rash.  Neurological:     Mental Status: She is alert and oriented to person, place, and time.     Cranial Nerves: No cranial nerve deficit.     Motor: No abnormal muscle tone.     Coordination: Coordination normal.     Deep Tendon Reflexes: Reflexes are normal and symmetric.  Psychiatric:        Behavior: Behavior normal.        Thought Content: Thought content normal.        Judgment: Judgment normal.           Assessment & Plan:  Ocular migraine I believe the patient is dealing with an ocular migraine.  She has no visual field deficit.  Her vision was normal.  Her funduscopic exam was normal.  I would like to consult ophthalmology for a second opinion to rule out any retinal tear.  However I believe that the patient would benefit from taking Nurtec 75 mg daily as needed to stop the headache.  I gave her 4 sample pills.

## 2024-01-01 ENCOUNTER — Encounter: Payer: Self-pay | Admitting: Family Medicine

## 2024-05-17 ENCOUNTER — Other Ambulatory Visit (HOSPITAL_COMMUNITY): Payer: Self-pay | Admitting: Family Medicine

## 2024-05-17 DIAGNOSIS — Z1231 Encounter for screening mammogram for malignant neoplasm of breast: Secondary | ICD-10-CM

## 2024-05-24 ENCOUNTER — Ambulatory Visit (HOSPITAL_COMMUNITY)
Admission: RE | Admit: 2024-05-24 | Discharge: 2024-05-24 | Disposition: A | Discharge status: Home / Self Care | Source: Ambulatory Visit | Attending: Family Medicine | Admitting: Family Medicine

## 2024-05-24 DIAGNOSIS — Z1231 Encounter for screening mammogram for malignant neoplasm of breast: Secondary | ICD-10-CM | POA: Insufficient documentation
